# Patient Record
Sex: Male | Born: 1940 | Race: Black or African American | Hispanic: No | Marital: Married | Smoking: Current some day smoker
Health system: Southern US, Community
[De-identification: ages and names within clinical notes are randomized; demographics above are authoritative.]

## PROBLEM LIST (undated history)

## (undated) DIAGNOSIS — I10 Essential (primary) hypertension: Secondary | ICD-10-CM

---

## 2014-07-08 ENCOUNTER — Emergency Department (HOSPITAL_COMMUNITY): Payer: PRIVATE HEALTH INSURANCE

## 2014-07-08 ENCOUNTER — Encounter (HOSPITAL_COMMUNITY): Payer: Self-pay | Admitting: *Deleted

## 2014-07-08 ENCOUNTER — Emergency Department (HOSPITAL_COMMUNITY)
Admission: EM | Admit: 2014-07-08 | Discharge: 2014-07-08 | Disposition: A | Discharge status: Home / Self Care | Payer: PRIVATE HEALTH INSURANCE | Attending: Emergency Medicine | Admitting: Emergency Medicine

## 2014-07-08 DIAGNOSIS — R531 Weakness: Secondary | ICD-10-CM | POA: Insufficient documentation

## 2014-07-08 DIAGNOSIS — J159 Unspecified bacterial pneumonia: Secondary | ICD-10-CM | POA: Diagnosis not present

## 2014-07-08 DIAGNOSIS — R509 Fever, unspecified: Secondary | ICD-10-CM | POA: Diagnosis present

## 2014-07-08 DIAGNOSIS — I10 Essential (primary) hypertension: Secondary | ICD-10-CM | POA: Diagnosis not present

## 2014-07-08 DIAGNOSIS — J189 Pneumonia, unspecified organism: Secondary | ICD-10-CM

## 2014-07-08 HISTORY — DX: Essential (primary) hypertension: I10

## 2014-07-08 LAB — COMPREHENSIVE METABOLIC PANEL
ALBUMIN: 3.3 g/dL — AB (ref 3.5–5.2)
ALK PHOS: 50 U/L (ref 39–117)
ALT: 30 U/L (ref 0–53)
AST: 44 U/L — ABNORMAL HIGH (ref 0–37)
Anion gap: 8 (ref 5–15)
BILIRUBIN TOTAL: 1.7 mg/dL — AB (ref 0.3–1.2)
BUN: 10 mg/dL (ref 6–23)
CHLORIDE: 103 mmol/L (ref 96–112)
CO2: 23 mmol/L (ref 19–32)
Calcium: 8.6 mg/dL (ref 8.4–10.5)
Creatinine, Ser: 1.15 mg/dL (ref 0.50–1.35)
GFR calc Af Amer: 71 mL/min — ABNORMAL LOW (ref >90)
GFR calc non Af Amer: 61 mL/min — ABNORMAL LOW (ref >90)
GLUCOSE: 85 mg/dL (ref 70–99)
Potassium: 4.3 mmol/L (ref 3.5–5.1)
Sodium: 134 mmol/L — ABNORMAL LOW (ref 135–145)
Total Protein: 6.7 g/dL (ref 6.0–8.3)

## 2014-07-08 LAB — CBC WITH DIFFERENTIAL/PLATELET
Basophils Absolute: 0 10*3/uL (ref 0.0–0.1)
Basophils Relative: 0 % (ref 0–1)
Eosinophils Absolute: 0 10*3/uL (ref 0.0–0.7)
Eosinophils Relative: 0 % (ref 0–5)
HCT: 38.5 % — ABNORMAL LOW (ref 39.0–52.0)
HEMOGLOBIN: 12.8 g/dL — AB (ref 13.0–17.0)
Lymphocytes Relative: 13 % (ref 12–46)
Lymphs Abs: 1.4 10*3/uL (ref 0.7–4.0)
MCH: 27.4 pg (ref 26.0–34.0)
MCHC: 33.2 g/dL (ref 30.0–36.0)
MCV: 82.4 fL (ref 78.0–100.0)
MONO ABS: 1.1 10*3/uL — AB (ref 0.1–1.0)
Monocytes Relative: 11 % (ref 3–12)
NEUTROS ABS: 7.7 10*3/uL (ref 1.7–7.7)
Neutrophils Relative %: 76 % (ref 43–77)
Platelets: 235 10*3/uL (ref 150–400)
RBC: 4.67 MIL/uL (ref 4.22–5.81)
RDW: 14.5 % (ref 11.5–15.5)
WBC: 10.2 10*3/uL (ref 4.0–10.5)

## 2014-07-08 MED ORDER — DEXTROSE 5 % IV SOLN
1.0000 g | Freq: Once | INTRAVENOUS | Status: AC
  Administered 2014-07-08: 1 g via INTRAVENOUS
  Filled 2014-07-08: qty 10

## 2014-07-08 MED ORDER — LEVOFLOXACIN 500 MG PO TABS
500.0000 mg | ORAL_TABLET | Freq: Once | ORAL | Status: AC
  Administered 2014-07-08: 500 mg via ORAL
  Filled 2014-07-08: qty 1

## 2014-07-08 MED ORDER — LEVOFLOXACIN 500 MG PO TABS: 500.0000 mg | ORAL_TABLET | Freq: Every day | ORAL | Status: DC

## 2014-07-08 MED ORDER — ACETAMINOPHEN 325 MG PO TABS
650.0000 mg | ORAL_TABLET | Freq: Once | ORAL | Status: AC
Start: 2014-07-08 — End: 2014-07-08
  Administered 2014-07-08: 650 mg via ORAL
  Filled 2014-07-08: qty 2

## 2014-07-08 MED ORDER — SODIUM CHLORIDE 0.9 % IV BOLUS (SEPSIS)
1000.0000 mL | Freq: Once | INTRAVENOUS | Status: AC
  Administered 2014-07-08: 1000 mL via INTRAVENOUS

## 2014-07-08 NOTE — ED Notes (Signed)
States he just moved here from [place] 4 days ago. Drove here. Since has had fever, sinus congestion, and feels weak. Pt appears in nad. Denies cp or sob. Alert and oriented. Ambulatory without any difficulty.

## 2014-07-08 NOTE — ED Provider Notes (Signed)
CSN: [id number]     Arrival date & time 07/08/14  1851 History   First MD Initiated Contact with Patient 07/08/14 1921     Chief Complaint  Patient presents with  . Nasal Congestion  . Weakness  . Fever     Patient is a 74 y.o. male presenting with weakness and fever. The history is provided by the patient. No language interpreter was used.  Weakness  Fever  Steven Lane presents for the evaluation of fever and weakness. He reports 3 days of feeling poorly. He has subjective fevers at home. He has nasal congestion and sore throat with sneezing and cough. He has no chest pain or shortness of breath. He has no abdominal pain, vomiting, diarrhea, leg swelling or pain. He has a history of high blood pressure. He has no known sick contacts. He recently moved here 4 days ago from [place] and had a 12 Hour drive. Symptoms are moderate, constant, worsening.  Past Medical History  Diagnosis Date  . Hypertension    History reviewed. No pertinent past surgical history. History reviewed. No pertinent family history. History  Substance Use Topics  . Smoking status: Never Smoker   . Smokeless tobacco: Not on file  . Alcohol Use: Yes    Review of Systems  Constitutional: Positive for fever.  Neurological: Positive for weakness.  All other systems reviewed and are negative.     Allergies  Review of patient's allergies indicates no known allergies.  Home Medications   Prior to Admission medications   Not on File   BP 149/68 mmHg  Pulse 68  Temp(Src) 99.1 F (37.3 C) (Oral)  Resp 19  Ht 5\' 9"  (1.753 m)  Wt 158 lb (71.668 kg)  BMI 23.32 kg/m2  SpO2 93% Physical Exam  Constitutional: He is oriented to person, place, and time. He appears well-developed and well-nourished.  HENT:  Head: Normocephalic and atraumatic.  Cardiovascular: Normal rate and regular rhythm.   No murmur heard. Pulmonary/Chest: Effort normal and breath sounds normal. No respiratory distress.  Abdominal:  Soft. There is no tenderness. There is no rebound and no guarding.  Musculoskeletal: He exhibits no edema or tenderness.  Neurological: He is alert and oriented to person, place, and time.  Skin: Skin is warm and dry.  Psychiatric: He has a normal mood and affect. His behavior is normal.  Nursing note and vitals reviewed.   ED Course  Procedures (including critical care time) Labs Review Labs Reviewed  COMPREHENSIVE METABOLIC PANEL  CBC WITH DIFFERENTIAL/PLATELET  URINALYSIS, ROUTINE W REFLEX MICROSCOPIC    Imaging Review Dg Chest 2 View  07/08/2014   CLINICAL DATA:  Nasal congestion, weakness, fever for 3 days.  EXAM: CHEST  2 VIEW  COMPARISON:  None.  FINDINGS: The lungs are hyperinflated. There is ill-defined patchy opacity in the right lower lung zone. Minimal retrocardiac subsegmental atelectasis. Minimal blunting of both costophrenic angles, may reflect immediate effusions. The cardiomediastinal contours are normal. Pulmonary vasculature is normal. No pneumothorax. No acute osseous abnormalities are seen.  IMPRESSION: 1. Ill-defined patchy opacity in the right lower lung zone, may reflect pneumonia in the appropriate clinical setting. Continued radiographic follow-up is recommended after course of treatment to document resolution. 2. Suggestion of small bilateral pleural effusions.   Electronically Signed   By: Rubye OaksMelanie  Ehinger M.D.   On: 07/08/2014 21:16     EKG Interpretation None      MDM   Final diagnoses:  Community acquired pneumonia    Patient here  for fever, cough, upper respiratory symptoms. Patient with no acute respiratory distress in the emergency department. Clinical picture is not consistent with sepsis, CHF, respiratory failure. Labs not currently crossing over into the system. CBC is within normal limits. CMP is within normal limits. Chest x-ray is consistent with acute pneumonia given patient's clinical presentation. Treating with one dose of Rocephin and  Levaquin in the emergency department. Provided prescription for Levaquin and home to start tomorrow. Discussed with patient homecare for pneumonia as well as close return precautions for worsening symptoms.   Tilden Fossa, MD 07/08/14 2213

## 2014-07-08 NOTE — Discharge Instructions (Signed)
Please get a repeat chest x ray in the next month to ensure that your pneumonia resolves.     Pneumonia Pneumonia is an infection of the lungs.  CAUSES Pneumonia may be caused by bacteria or a virus. Usually, these infections are caused by breathing infectious particles into the lungs (respiratory tract). SIGNS AND SYMPTOMS   Cough.  Fever.  Chest pain.  Increased rate of breathing.  Wheezing.  Mucus production. DIAGNOSIS  If you have the common symptoms of pneumonia, your health care provider will typically confirm the diagnosis with a chest X-ray. The X-ray will show an abnormality in the lung (pulmonary infiltrate) if you have pneumonia. Other tests of your blood, urine, or sputum may be done to find the specific cause of your pneumonia. Your health care provider may also do tests (blood gases or pulse oximetry) to see how well your lungs are working. TREATMENT  Some forms of pneumonia may be spread to other people when you cough or sneeze. You may be asked to wear a mask before and during your exam. Pneumonia that is caused by bacteria is treated with antibiotic medicine. Pneumonia that is caused by the influenza virus may be treated with an antiviral medicine. Most other viral infections must run their course. These infections will not respond to antibiotics.  HOME CARE INSTRUCTIONS   Cough suppressants may be used if you are losing too much rest. However, coughing protects you by clearing your lungs. You should avoid using cough suppressants if you can.  Your health care provider may have prescribed medicine if he or she thinks your pneumonia is caused by bacteria or influenza. Finish your medicine even if you start to feel better.  Your health care provider may also prescribe an expectorant. This loosens the mucus to be coughed up.  Take medicines only as directed by your health care provider.  Do not smoke. Smoking is a common cause of bronchitis and can contribute to  pneumonia. If you are a smoker and continue to smoke, your cough may last several weeks after your pneumonia has cleared.  A cold steam vaporizer or humidifier in your room or home may help loosen mucus.  Coughing is often worse at night. Sleeping in a semi-upright position in a recliner or using a couple pillows under your head will help with this.  Get rest as you feel it is needed. Your body will usually let you know when you need to rest. PREVENTION A pneumococcal shot (vaccine) is available to prevent a common bacterial cause of pneumonia. This is usually suggested for:  People over 75 years old.  Patients on chemotherapy.  People with chronic lung problems, such as bronchitis or emphysema.  People with immune system problems. If you are over 65 or have a high risk condition, you may receive the pneumococcal vaccine if you have not received it before. In some countries, a routine influenza vaccine is also recommended. This vaccine can help prevent some cases of pneumonia.You may be offered the influenza vaccine as part of your care. If you smoke, it is time to quit. You may receive instructions on how to stop smoking. Your health care provider can provide medicines and counseling to help you quit. SEEK MEDICAL CARE IF: You have a fever. SEEK IMMEDIATE MEDICAL CARE IF:   Your illness becomes worse. This is especially true if you are elderly or weakened from any other disease.  You cannot control your cough with suppressants and are losing sleep.  You begin  coughing up blood.  You develop pain which is getting worse or is uncontrolled with medicines.  Any of the symptoms which initially brought you in for treatment are getting worse rather than better.  You develop shortness of breath or chest pain. MAKE SURE YOU:   Understand these instructions.  Will watch your condition.  Will get help right away if you are not doing well or get worse. Document Released: 04/20/2005  Document Revised: 09/04/2013 Document Reviewed: 07/10/2010 [facility] Patient Information 2015 [place], [place]. This information is not intended to replace advice given to you by your health care provider. Make sure you discuss any questions you have with your health care provider.

## 2014-07-09 LAB — CBC WITH DIFFERENTIAL/PLATELET

## 2014-09-14 ENCOUNTER — Ambulatory Visit: Payer: PRIVATE HEALTH INSURANCE

## 2014-09-25 ENCOUNTER — Ambulatory Visit: Payer: PRIVATE HEALTH INSURANCE | Attending: Internal Medicine

## 2014-10-12 ENCOUNTER — Emergency Department (INDEPENDENT_AMBULATORY_CARE_PROVIDER_SITE_OTHER)
Admission: EM | Admit: 2014-10-12 | Discharge: 2014-10-12 | Disposition: A | Discharge status: Home / Self Care | Payer: PRIVATE HEALTH INSURANCE | Source: Home / Self Care | Attending: Family Medicine | Admitting: Family Medicine

## 2014-10-12 ENCOUNTER — Encounter (HOSPITAL_COMMUNITY): Payer: Self-pay | Admitting: *Deleted

## 2014-10-12 DIAGNOSIS — I10 Essential (primary) hypertension: Secondary | ICD-10-CM

## 2014-10-12 HISTORY — DX: Essential (primary) hypertension: I10

## 2014-10-12 NOTE — ED Notes (Signed)
Pt was here for Rx refills. However, pt still has 2 refills left on all 3 of his current prescribed meds at [facility] on [address]. Pt is a new pt at the [facility] and wants Rxs filled there. Development worker, community and [facility] contacted. Both have pts info on file. Pt sent to [facility]. Dr. Denyse Amass aware, denies need to see pt.

## 2014-10-12 NOTE — ED Provider Notes (Signed)
She presents to clinic today for medication refill. He has active prescriptions with refills at a [facility]. He wants to have his prescriptions filled at the [facility] pharmacy. He was unaware that these could be transferred. The RN successfully arranged for the transfer. The patient does not need to be seen by a physician today because he has no problems.  Filed Vitals:   10/12/14 1523  BP: 121/62  Pulse: 62  Temp: 97.8 F (36.6 C)  Resp: 16     Rodolph Bong, MD 10/12/14 1528

## 2014-10-12 NOTE — ED Notes (Signed)
Pt is here for RX refill. Prescriptions just need transferred from [facility] on [address] to [facility] pharmacy.

## 2014-12-04 ENCOUNTER — Encounter: Payer: Self-pay | Admitting: Family Medicine

## 2014-12-04 ENCOUNTER — Ambulatory Visit: Payer: Self-pay | Attending: Family Medicine | Admitting: Family Medicine

## 2014-12-04 VITALS — BP 159/97 | HR 65 | Temp 97.4°F | Ht 66.0 in | Wt 165.0 lb

## 2014-12-04 DIAGNOSIS — R972 Elevated prostate specific antigen [PSA]: Secondary | ICD-10-CM

## 2014-12-04 DIAGNOSIS — I10 Essential (primary) hypertension: Secondary | ICD-10-CM | POA: Insufficient documentation

## 2014-12-04 DIAGNOSIS — N400 Enlarged prostate without lower urinary tract symptoms: Secondary | ICD-10-CM | POA: Insufficient documentation

## 2014-12-04 LAB — BASIC METABOLIC PANEL
BUN: 14 mg/dL (ref 7–25)
CO2: 26 mmol/L (ref 20–31)
Calcium: 9.2 mg/dL (ref 8.6–10.3)
Chloride: 101 mmol/L (ref 98–110)
Creat: 0.98 mg/dL (ref 0.70–1.18)
Glucose, Bld: 83 mg/dL (ref 65–99)
POTASSIUM: 4.1 mmol/L (ref 3.5–5.3)
Sodium: 137 mmol/L (ref 135–146)

## 2014-12-04 MED ORDER — TERAZOSIN HCL 5 MG PO CAPS: 5.0000 mg | ORAL_CAPSULE | Freq: Every day | ORAL | Status: DC

## 2014-12-04 MED ORDER — AMLODIPINE BESYLATE 10 MG PO TABS: 10.0000 mg | ORAL_TABLET | Freq: Every day | ORAL | Status: DC

## 2014-12-04 MED ORDER — LISINOPRIL 20 MG PO TABS: 20.0000 mg | ORAL_TABLET | Freq: Every day | ORAL | Status: DC

## 2014-12-04 NOTE — Progress Notes (Signed)
Subjective:    Patient ID: Steven Lane, male    DOB: 04/18/1941, 74 y.o.   MRN: [id number]  HPI  Steven Lane is a 74 year old male with a history of hypertension and benign prostatic hyperplasia who relocated from [place] 4 months ago and is without a primary care physician. He  Had presented to the urgent care for a refill of his medications where he was not seen and was referred to the clinic here.  Of his antihypertensives and was told he needed to establish care here. He has no complaints at this time;  States he has been out of his medications for the last 4 days.  Past Medical History  Diagnosis Date  . Hypertension   . HTN (hypertension)     History reviewed. No pertinent past surgical history.  History   Social History  . Marital Status: Married    Spouse Name: N/A  . Number of Children: N/A  . Years of Education: N/A   Occupational History  . Not on file.   Social History Main Topics  . Smoking status: Never Smoker   . Smokeless tobacco: Not on file  . Alcohol Use: Yes     Comment: occ  . Drug Use: No  . Sexual Activity: Not on file   Other Topics Concern  . Not on file   Social History Narrative    No Known Allergies  No current outpatient prescriptions on file prior to visit.   No current facility-administered medications on file prior to visit.      Review of Systems  Constitutional: Negative for activity change and appetite change.  HENT: Negative for sinus pressure and sore throat.   Eyes: Negative for visual disturbance.  Respiratory: Negative for cough, chest tightness and shortness of breath.   Cardiovascular: Negative for chest pain and leg swelling.  Gastrointestinal: Negative for abdominal pain, diarrhea, constipation and abdominal distention.  Endocrine: Negative.   Genitourinary: Negative for dysuria.  Musculoskeletal: Negative for myalgias and joint swelling.  Skin: Negative for rash.  Allergic/Immunologic: Negative.     Neurological: Negative for weakness, light-headedness and numbness.  Psychiatric/Behavioral: Negative for suicidal ideas and dysphoric mood.         Objective: Filed Vitals:   12/04/14 1237  BP: 159/97  Pulse: 65  Temp: 97.4 F (36.3 C)  Height:  (1.676 m)  Weight: 165 lb (74.844 kg)  SpO2: 94%      Physical Exam  Constitutional: He is oriented to person, place, and time. He appears well-developed and well-nourished.  Cardiovascular: Normal rate, normal heart sounds and intact distal pulses.   No murmur heard. Pulmonary/Chest: Effort normal and breath sounds normal. He has no wheezes. He has no rales. He exhibits no tenderness.  Abdominal: Soft. Bowel sounds are normal. He exhibits no distension and no mass. There is no tenderness.  Musculoskeletal: Normal range of motion.  Neurological: He is alert and oriented to person, place, and time.            Assessment & Plan:   74 year old male with a history of hypertension and BPH.    Hypertension: Uncontrolled due to running out of medications which I have refilled at this time.  Basic metabolic panel ordered to evaluate renal function given he is on ACE inhibitor. Advised and low-sodium, DASH diet.   BPH: Controlled on Terazosin   This note has been created with Education officer, environmental. Any transcriptional errors are unintentional.

## 2014-12-04 NOTE — Progress Notes (Signed)
Quick Note:  Please inform the patient that labs are normal. Thank you. ______ 

## 2014-12-04 NOTE — Progress Notes (Signed)
Patient here to establish care and to get medication refills on his HTN medications He states he has been with out medications for 3 days No other complaints Pressure today is 159/97 Pulse 65

## 2014-12-04 NOTE — Patient Instructions (Signed)

## 2014-12-05 ENCOUNTER — Telehealth: Payer: Self-pay | Admitting: *Deleted

## 2014-12-05 NOTE — Telephone Encounter (Signed)
-----   Message from Enobong Amao, MD sent at 12/04/2014 11:19 PM EDT ----- Please inform the patient that labs are normal. Thank you. 

## 2014-12-05 NOTE — Telephone Encounter (Signed)
Verified name and date of birth and relayed normal lab results.  Patient had no questions and verbalized understanding.

## 2015-01-23 ENCOUNTER — Ambulatory Visit: Payer: Self-pay | Attending: Internal Medicine | Admitting: Internal Medicine

## 2015-01-23 ENCOUNTER — Encounter: Payer: Self-pay | Admitting: Internal Medicine

## 2015-01-23 ENCOUNTER — Other Ambulatory Visit: Payer: Self-pay | Admitting: Family Medicine

## 2015-01-23 VITALS — BP 149/74 | HR 64 | Temp 98.0°F | Resp 16 | Ht 66.0 in | Wt 163.8 lb

## 2015-01-23 DIAGNOSIS — N400 Enlarged prostate without lower urinary tract symptoms: Secondary | ICD-10-CM | POA: Insufficient documentation

## 2015-01-23 DIAGNOSIS — I10 Essential (primary) hypertension: Secondary | ICD-10-CM | POA: Insufficient documentation

## 2015-01-23 DIAGNOSIS — Z23 Encounter for immunization: Secondary | ICD-10-CM | POA: Insufficient documentation

## 2015-01-23 MED ORDER — TERAZOSIN HCL 5 MG PO CAPS: 5.0000 mg | ORAL_CAPSULE | Freq: Every day | ORAL | Status: DC

## 2015-01-23 MED ORDER — LISINOPRIL 20 MG PO TABS: 20.0000 mg | ORAL_TABLET | Freq: Every day | ORAL | Status: DC

## 2015-01-23 MED ORDER — AMLODIPINE BESYLATE 10 MG PO TABS: 10.0000 mg | ORAL_TABLET | Freq: Every day | ORAL | Status: DC

## 2015-01-23 NOTE — Progress Notes (Signed)
Patient here for follow up on his HTN and for  Medication refills Patient stated he did have an ulcer in the past and that The prilosec seems to help

## 2015-01-23 NOTE — Progress Notes (Signed)
Patient ID: Steven Lane, male   DOB: 10-01-1940, 74 y.o.   MRN: [id number] Subjective:  Steven Lane is a 74 y.o. male with hypertension. Current Outpatient Prescriptions  Medication Sig Dispense Refill  . amLODipine (NORVASC) 10 MG tablet Take 1 tablet (10 mg total) by mouth daily. 30 tablet 1  . aspirin 81 MG tablet Take 81 mg by mouth daily.    Marland Kitchen lisinopril (PRINIVIL,ZESTRIL) 20 MG tablet Take 1 tablet (20 mg total) by mouth daily. 30 tablet 1  . Omeprazole (PRILOSEC PO) Take by mouth.    . terazosin (HYTRIN) 5 MG capsule Take 1 capsule (5 mg total) by mouth at bedtime. 30 capsule 1   No current facility-administered medications for this visit.    Hypertension ROS: taking medications as instructed, no medication side effects noted, no TIA's, no chest pain on exertion, no dyspnea on exertion, no swelling of ankles and no palpitations.  New concerns: has had acid reflux and ulcers in he past. He admits to eating spicy foods daily.   Objective:  BP 149/74 mmHg  Pulse 64  Temp(Src) 98 F (36.7 C)  Resp 16  Ht  (1.676 m)  Wt 163 lb 12.8 oz (74.299 kg)  BMI 26.45 kg/m2  SpO2 100%  Appearance alert, well appearing, and in no distress, oriented to person, place, and time and normal appearing weight. General exam BP noted to be well controlled today in office, S1, S2 normal, no gallop, no murmur, chest clear, no JVD, no HSM, no edema.  Lab review: labs are reviewed, up to date and normal.   Assessment:   Hypertension stable and needs to follow diet more regularly.  BPH: Terazosin refilled. Will check PSA on next visit Influenza vaccine given today   Plan:  Current treatment plan is effective, no change in therapy. Reviewed diet, exercise and weight control. Recommended sodium restriction. Cardiovascular risk and specific lipid/LDL goals reviewed.    Return in about 3 months (around 04/24/2015) for Hypertension.  Ambrose Finland, NP 01/23/2015 4:23 PM

## 2015-05-26 ENCOUNTER — Emergency Department (HOSPITAL_COMMUNITY): Payer: Self-pay

## 2015-05-26 ENCOUNTER — Emergency Department (HOSPITAL_COMMUNITY)
Admission: EM | Admit: 2015-05-26 | Discharge: 2015-05-26 | Disposition: A | Discharge status: Home / Self Care | Payer: Self-pay | Attending: Emergency Medicine | Admitting: Emergency Medicine

## 2015-05-26 ENCOUNTER — Encounter (HOSPITAL_COMMUNITY): Payer: Self-pay | Admitting: Emergency Medicine

## 2015-05-26 DIAGNOSIS — I10 Essential (primary) hypertension: Secondary | ICD-10-CM | POA: Insufficient documentation

## 2015-05-26 DIAGNOSIS — Z79899 Other long term (current) drug therapy: Secondary | ICD-10-CM | POA: Insufficient documentation

## 2015-05-26 DIAGNOSIS — Y9389 Activity, other specified: Secondary | ICD-10-CM | POA: Insufficient documentation

## 2015-05-26 DIAGNOSIS — J3489 Other specified disorders of nose and nasal sinuses: Secondary | ICD-10-CM | POA: Insufficient documentation

## 2015-05-26 DIAGNOSIS — R55 Syncope and collapse: Secondary | ICD-10-CM | POA: Insufficient documentation

## 2015-05-26 DIAGNOSIS — Z7982 Long term (current) use of aspirin: Secondary | ICD-10-CM | POA: Insufficient documentation

## 2015-05-26 DIAGNOSIS — Z043 Encounter for examination and observation following other accident: Secondary | ICD-10-CM | POA: Insufficient documentation

## 2015-05-26 DIAGNOSIS — R001 Bradycardia, unspecified: Secondary | ICD-10-CM | POA: Insufficient documentation

## 2015-05-26 DIAGNOSIS — Y9289 Other specified places as the place of occurrence of the external cause: Secondary | ICD-10-CM | POA: Insufficient documentation

## 2015-05-26 DIAGNOSIS — Y998 Other external cause status: Secondary | ICD-10-CM | POA: Insufficient documentation

## 2015-05-26 LAB — URINALYSIS, ROUTINE W REFLEX MICROSCOPIC
Bilirubin Urine: NEGATIVE
Glucose, UA: NEGATIVE mg/dL
HGB URINE DIPSTICK: NEGATIVE
KETONES UR: 15 mg/dL — AB
Leukocytes, UA: NEGATIVE
Nitrite: NEGATIVE
PROTEIN: NEGATIVE mg/dL
Specific Gravity, Urine: 1.014 (ref 1.005–1.030)
pH: 6 (ref 5.0–8.0)

## 2015-05-26 LAB — I-STAT CG4 LACTIC ACID, ED: Lactic Acid, Venous: 0.93 mmol/L (ref 0.5–2.0)

## 2015-05-26 LAB — CBC
HCT: 40.1 % (ref 39.0–52.0)
Hemoglobin: 13.7 g/dL (ref 13.0–17.0)
MCH: 28.1 pg (ref 26.0–34.0)
MCHC: 34.2 g/dL (ref 30.0–36.0)
MCV: 82.2 fL (ref 78.0–100.0)
PLATELETS: 218 10*3/uL (ref 150–400)
RBC: 4.88 MIL/uL (ref 4.22–5.81)
RDW: 13.9 % (ref 11.5–15.5)
WBC: 5.7 10*3/uL (ref 4.0–10.5)

## 2015-05-26 LAB — BASIC METABOLIC PANEL
Anion gap: 12 (ref 5–15)
BUN: 11 mg/dL (ref 6–20)
CALCIUM: 9.2 mg/dL (ref 8.9–10.3)
CHLORIDE: 105 mmol/L (ref 101–111)
CO2: 21 mmol/L — ABNORMAL LOW (ref 22–32)
CREATININE: 1.07 mg/dL (ref 0.61–1.24)
GFR calc Af Amer: >60 mL/min (ref >60)
GFR calc non Af Amer: >60 mL/min (ref >60)
Glucose, Bld: 91 mg/dL (ref 65–99)
Potassium: 4.6 mmol/L (ref 3.5–5.1)
Sodium: 138 mmol/L (ref 135–145)

## 2015-05-26 LAB — I-STAT TROPONIN, ED: TROPONIN I, POC: 0.01 ng/mL (ref 0.00–0.08)

## 2015-05-26 MED ORDER — SODIUM CHLORIDE 0.9 % IV BOLUS (SEPSIS)
1000.0000 mL | Freq: Once | INTRAVENOUS | Status: AC
  Administered 2015-05-26: 1000 mL via INTRAVENOUS

## 2015-05-26 NOTE — ED Notes (Signed)
Water given as PO challenge as requested.

## 2015-05-26 NOTE — ED Provider Notes (Signed)
CSN: [id number]     Arrival date & time 05/26/15  1456 History   First MD Initiated Contact with Patient 05/26/15 1808     Chief Complaint  Patient presents with  . Fall  . Bradycardia     (Consider location/radiation/quality/duration/timing/severity/associated sxs/prior Treatment) HPI 57 y M w PMH htn who presents with a 4 days of cough/runny nose, no fevers, no vomiting/diarrhea but says he hasn't been eating very well.  Today he went to have a bowel movement which was normal.  After the bowel movement he stood up and he began feeling lightheaded and had a syncopal episode.  During the syncope he fell hitting his head.  He woke up and has since come back to baseline.  He denies any complaints at this time.  No chest pain/sob.   Past Medical History  Diagnosis Date  . Hypertension   . HTN (hypertension)    History reviewed. No pertinent past surgical history. No family history on file. Social History  Substance Use Topics  . Smoking status: Never Smoker   . Smokeless tobacco: None  . Alcohol Use: Yes     Comment: occ    Review of Systems  Constitutional: Negative for fever and chills.  HENT: Positive for rhinorrhea.   Eyes: Negative for redness.  Respiratory: Positive for cough. Negative for shortness of breath.   Cardiovascular: Negative for chest pain.  Gastrointestinal: Negative for nausea, vomiting, abdominal pain and diarrhea.  Genitourinary: Negative for dysuria.  Skin: Negative for rash.  Neurological: Positive for syncope. Negative for headaches.  All other systems reviewed and are negative.     Allergies  Review of patient's allergies indicates no known allergies.  Home Medications   Prior to Admission medications   Medication Sig Start Date End Date Taking? Authorizing Provider  amLODipine (NORVASC) 10 MG tablet Take 1 tablet (10 mg total) by mouth daily. 01/23/15  Yes Ambrose Finland, NP  aspirin 81 MG tablet Take 81 mg by mouth daily.   Yes Historical  Provider, MD  Dextromethorphan-Guaifenesin (ROBITUSSIN DM PO) Take 10 mLs by mouth daily as needed. For cough   Yes Historical Provider, MD  lisinopril (PRINIVIL,ZESTRIL) 20 MG tablet Take 1 tablet (20 mg total) by mouth daily. 01/23/15  Yes Ambrose Finland, NP  Omeprazole (PRILOSEC PO) Take 1 tablet by mouth daily.    Yes Historical Provider, MD  terazosin (HYTRIN) 5 MG capsule Take 1 capsule (5 mg total) by mouth at bedtime. 01/23/15  Yes Ambrose Finland, NP   BP 147/80 mmHg  Pulse 69  Temp(Src) 97.9 F (36.6 C) (Oral)  Resp 18  Ht  (1.651 m)  Wt 72.122 kg  BMI 26.46 kg/m2  SpO2 95% Physical Exam  Constitutional: He is oriented to person, place, and time. No distress.  HENT:  Head: Normocephalic and atraumatic.  Eyes: EOM are normal. Pupils are equal, round, and reactive to light.  Neck: Normal range of motion. Neck supple.  Cardiovascular: Regular rhythm and normal heart sounds.   Pulmonary/Chest: Effort normal and breath sounds normal. No respiratory distress.  Abdominal: Soft. There is no tenderness.  Musculoskeletal: Normal range of motion.  Neurological: He is alert and oriented to person, place, and time.  Skin: No rash noted. He is not diaphoretic.  Psychiatric: He has a normal mood and affect.  Nursing note reviewed.   ED Course  Procedures (including critical care time) Labs Review Labs Reviewed  BASIC METABOLIC PANEL - Abnormal; Notable for the following:  CO2 21 (*)    All other components within normal limits  URINALYSIS, ROUTINE W REFLEX MICROSCOPIC (NOT AT [facility]) - Abnormal; Notable for the following:    Ketones, ur 15 (*)    All other components within normal limits  CBC  CBG MONITORING, ED  I-STAT CG4 LACTIC ACID, ED  Rosezena Sensor, ED    Imaging Review Dg Chest 2 View  05/26/2015  CLINICAL DATA:  Cough and headache today, dizziness, fell in bathroom EXAM: CHEST  2 VIEW COMPARISON:  07/08/2014 FINDINGS: Heart size and vascular pattern are  normal. No consolidation effusion or pneumothorax. Bony thorax intact. IMPRESSION: No active cardiopulmonary disease. Electronically Signed   By: Esperanza Heir M.D.   On: 05/26/2015 19:38   Ct Head Wo Contrast  05/26/2015  CLINICAL DATA:  Loss of consciousness.  Headache. EXAM: CT HEAD WITHOUT CONTRAST TECHNIQUE: Contiguous axial images were obtained from the base of the skull through the vertex without intravenous contrast. COMPARISON:  None. FINDINGS: Skull and Sinuses:Negative for acute fracture. Mucosal thickening throughout the paranasal sinuses. Visualized orbits: Negative. Brain: No evidence of acute infarction, hemorrhage, hydrocephalus, or mass lesion/mass effect. There is generalized cerebral volume loss. Mild for age small-vessel ischemic change in the deep cerebral white matter, confluent around the lateral ventricles. Intracranial calcified atherosclerosis. IMPRESSION: No acute finding. Electronically Signed   By: Marnee Spring M.D.   On: 05/26/2015 19:44   I have personally reviewed and evaluated these images and lab results as part of my medical decision-making.   EKG Interpretation   Date/Time:  Sunday May 26 2015 15:35:14 EST Ventricular Rate:  60 PR Interval:  190 QRS Duration: 84 QT Interval:  412 QTC Calculation: 412 R Axis:   -11 Text Interpretation:  Normal sinus rhythm Non-specific ST-t changes No  previous tracing Confirmed by Denton Lank  MD, Caryn Bee ([id number]) on 05/26/2015  6:33:13 PM      MDM   Final diagnoses:  Syncope and collapse    86 y M w PMH htn who presents with a 4 days of cough/runny nose, no fevers, no vomiting/diarrhea who presents now with syncope.  Exam as above.  ekg unremarkable.  No wpw, long qt or brugada. AFVSS.  No heart murmur.    Given the fall and his age, will obtain ct head.  He has no c/t/l spine ttp.  No chest wall or abdominal ttp.  Will obtain cbc/bmp to further look for causes of syncope such as anemia or electroyte abnormality.   Will obtain orthostatics  Orthostatics neg.  Cbc/bmp unremarkable.  Ct head neg.  cxr obtained and neg for pna or other abnormality.  At this point it seems that his syncope was most likely 2/2 dehydration from decreased PO intake over the past couple of days or possible vagal episode following the bowel movement.  Patient does have mild bradycardia in the department which is sinus and he has a good bp.  Will have him follow up with pcp to keep following that.  Using the [facility] syncope rules, feel that he is sufficiently low risk for dangerous cardiac etiology of syncope and feel he is safe for d/c at this time.  I have discussed the results, Dx and Tx plan with the pt. They expressed understanding and agree with the plan and were told to return to ED with any worsening of condition or concern.    Disposition: Discharge  Condition: Good  Discharge Medication List as of 05/26/2015  8:36 PM  Follow Up: [facility] EMERGENCY DEPARTMENT [address] [id number] mc [place] [address] [phone]  If symptoms worsen, As needed  [facility] [address] [place] [address] [phone]  Please follow up with primary care in one week for mild bradycardia.   Pt seen in conjunction with Dr. Imagene Sheller, MD 05/27/15 1238  Cathren Laine, MD 05/28/15 1620

## 2015-05-26 NOTE — ED Notes (Signed)
Pt reports that he was standing in the kitchen and felt like he had to go to bathroom. After using the bathroom pt became dizzy and fell. Pt reports + LOC hit head on bathroom floor. Speech clear, no facial droop, equal grips.

## 2015-05-26 NOTE — Discharge Instructions (Signed)

## 2015-05-26 NOTE — ED Notes (Signed)
Patient ambulated in hallway with no difficulty, MD aware. Prepare for dishcarge.

## 2015-05-30 ENCOUNTER — Other Ambulatory Visit: Payer: Self-pay | Admitting: Internal Medicine

## 2015-05-30 MED FILL — ?TERAZOSIN 5 MG CAPSULE: 5 | 30 days supply | Qty: 30 | Fill #3

## 2015-05-30 MED FILL — ?LISINOPRIL 20 MG TABLET: 20 | 30 days supply | Qty: 30 | Fill #3

## 2015-06-06 ENCOUNTER — Ambulatory Visit: Payer: Self-pay | Attending: Internal Medicine | Admitting: Internal Medicine

## 2015-06-06 ENCOUNTER — Encounter: Payer: Self-pay | Admitting: Internal Medicine

## 2015-06-06 VITALS — BP 115/74 | HR 58 | Temp 98.0°F | Resp 16 | Ht 66.0 in | Wt 165.0 lb

## 2015-06-06 DIAGNOSIS — R001 Bradycardia, unspecified: Secondary | ICD-10-CM | POA: Insufficient documentation

## 2015-06-06 DIAGNOSIS — N400 Enlarged prostate without lower urinary tract symptoms: Secondary | ICD-10-CM | POA: Insufficient documentation

## 2015-06-06 DIAGNOSIS — I10 Essential (primary) hypertension: Secondary | ICD-10-CM

## 2015-06-06 DIAGNOSIS — Z7982 Long term (current) use of aspirin: Secondary | ICD-10-CM | POA: Insufficient documentation

## 2015-06-06 DIAGNOSIS — Z79899 Other long term (current) drug therapy: Secondary | ICD-10-CM | POA: Insufficient documentation

## 2015-06-06 DIAGNOSIS — R55 Syncope and collapse: Secondary | ICD-10-CM

## 2015-06-06 MED ORDER — AMLODIPINE BESYLATE 10 MG PO TABS: 10.0000 mg | ORAL_TABLET | Freq: Every day | ORAL | Status: DC

## 2015-06-06 MED ORDER — LISINOPRIL 20 MG PO TABS: 20.0000 mg | ORAL_TABLET | Freq: Every day | ORAL | Status: DC

## 2015-06-06 MED FILL — ?AMLODIPINE BESYLATE 10 MG: 10 | 30 days supply | Qty: 30 | Fill #0

## 2015-06-06 NOTE — Patient Instructions (Signed)
Men's one a day vitamin

## 2015-06-06 NOTE — Progress Notes (Signed)
Patient ID: Steven Lane, male   DOB: 09/03/1940, 75 y.o.   MRN: [id number]  CC: syncope  HPI: Steven Lane is a 76 y.o. male here today for a follow up visit.  Patient has past medical history of HTN and BPH. Patient states that he was seen in the ER 10 days ago after suffering a syncopal event after having a bowel movement. He went to stand up after using the restroom and fell over hitting his head. Everything returned normal in the ER except the patient was slightly bradycardic at the time. Patient has not had anymore episodes since that time but he feels weak. He denies complaints of dizziness, chest pain, near syncope, or palpitations.   No Known Allergies Past Medical History  Diagnosis Date  . Hypertension   . HTN (hypertension)    Current Outpatient Prescriptions on File Prior to Visit  Medication Sig Dispense Refill  . amLODipine (NORVASC) 10 MG tablet Take 1 tablet (10 mg total) by mouth daily. Needs office visit for refills 30 tablet 0  . aspirin 81 MG tablet Take 81 mg by mouth daily.    Marland Kitchen lisinopril (PRINIVIL,ZESTRIL) 20 MG tablet Take 1 tablet (20 mg total) by mouth daily. 30 tablet 3  . Omeprazole (PRILOSEC PO) Take 1 tablet by mouth daily.     Marland Kitchen terazosin (HYTRIN) 5 MG capsule Take 1 capsule (5 mg total) by mouth at bedtime. 30 capsule 3  . Dextromethorphan-Guaifenesin (ROBITUSSIN DM PO) Take 10 mLs by mouth daily as needed. For cough     No current facility-administered medications on file prior to visit.   No family history on file. Social History   Social History  . Marital Status: Married    Spouse Name: N/A  . Number of Children: N/A  . Years of Education: N/A   Occupational History  . Not on file.   Social History Main Topics  . Smoking status: Never Smoker   . Smokeless tobacco: Not on file  . Alcohol Use: Yes     Comment: occ  . Drug Use: No  . Sexual Activity: Not on file   Other Topics Concern  . Not on file   Social History Narrative     Review of Systems: Constitutional: Negative for fever, chills, diaphoresis, activity change, appetite change and fatigue. HENT: Negative for ear pain, nosebleeds, congestion, facial swelling, rhinorrhea, neck pain, neck stiffness and ear discharge.  Eyes: Negative for pain, discharge, redness, itching and visual disturbance. Respiratory: Negative for cough, choking, chest tightness, shortness of breath, wheezing and stridor.  Cardiovascular: Negative for chest pain, palpitations and leg swelling. Gastrointestinal: Negative for abdominal distention. Genitourinary: Negative for dysuria, urgency, frequency, hematuria, flank pain, decreased urine volume, difficulty urinating and dyspareunia.  Musculoskeletal: Negative for back pain, joint swelling, arthralgias and gait problem. Neurological: Negative for dizziness, tremors, seizures, syncope, facial asymmetry, speech difficulty, weakness, light-headedness, numbness and headaches.  Hematological: Negative for adenopathy. Does not bruise/bleed easily. Psychiatric/Behavioral: Negative for hallucinations, behavioral problems, confusion, dysphoric mood, decreased concentration and agitation.    Objective:   Filed Vitals:   06/06/15 1400  BP: 115/74  Pulse: 58  Temp: 98 F (36.7 C)  Resp: 16    Physical Exam  Constitutional: He is oriented to person, place, and time.  Cardiovascular: Normal rate and normal heart sounds.   No murmur heard. Slightly bradycardic  Pulmonary/Chest: Effort normal and breath sounds normal.  Neurological: He is alert and oriented to person, place, and time.  Skin: Skin is  warm and dry.  Psychiatric: He has a normal mood and affect.     Lab Results  Component Value Date   WBC 5.7 05/26/2015   HGB 13.7 05/26/2015   HCT 40.1 05/26/2015   MCV 82.2 05/26/2015   PLT 218 05/26/2015   Lab Results  Component Value Date   CREATININE 1.07 05/26/2015   BUN 11 05/26/2015   NA 138 05/26/2015   K 4.6  05/26/2015   CL 105 05/26/2015   CO2 21* 05/26/2015    No results found for: HGBA1C Lipid Panel  No results found for: CHOL, TRIG, HDL, CHOLHDL, VLDL, LDLCALC     Assessment and plan:   Steven Lane was seen today for follow-up.  Diagnoses and all orders for this visit:  Vasovagal syncope I believe syncopal episode was vasovagal due to event happening after he had bowel movement. Patient slightly bradycardic but this is WNL for him.  I have advised that he stays hydrated Explained signs and symptoms that should warrant immediate attention.  Patient verbalized understanding with teach back used.  Essential hypertension -     amLODipine (NORVASC) 10 MG tablet; Take 1 tablet (10 mg total) by mouth daily. Needs office visit for refills -     lisinopril (PRINIVIL,ZESTRIL) 20 MG tablet; Take 1 tablet (20 mg total) by mouth daily. Patient blood pressure is stable and may continue on current medication.  Education on diet, exercise, and modifiable risk factors discussed. Will obtain appropriate labs as needed. Will follow up in 3-6 months.    Return in about 3 months (around 09/03/2015) for Hypertension.       Ambrose Finland, NP-C [facility] [phone] 06/06/2015, 2:09 PM

## 2015-06-06 NOTE — Progress Notes (Signed)
Patient is here for follow up from the ED Patient had a syncope episode and collapsed Patient stated this is the first time this has happened

## 2015-06-28 MED FILL — LISINOPRIL 20 MG TABLET: 20 | 30 days supply | Qty: 30 | Fill #0

## 2015-07-08 ENCOUNTER — Other Ambulatory Visit: Payer: Self-pay | Admitting: Internal Medicine

## 2015-07-08 MED FILL — ?TERAZOSIN 5 MG CAPSULE: 5 | 30 days supply | Qty: 30 | Fill #0

## 2015-07-08 MED FILL — ?AMLODIPINE BESYLATE 10 MG: 10 | 30 days supply | Qty: 30 | Fill #0

## 2015-07-22 ENCOUNTER — Ambulatory Visit: Payer: Self-pay | Attending: Internal Medicine

## 2015-08-01 MED FILL — ?TERAZOSIN 5 MG CAPSULE: 5 | 30 days supply | Qty: 30 | Fill #1

## 2015-08-01 MED FILL — AMLODIPINE BESYLATE 10 MG T: 10 | 30 days supply | Qty: 30 | Fill #1

## 2015-08-01 MED FILL — LISINOPRIL 20 MG TABLET: 20 | 30 days supply | Qty: 30 | Fill #1

## 2015-08-30 MED FILL — AMLODIPINE BESYLATE 10 MG T: 10 | 30 days supply | Qty: 30 | Fill #2

## 2015-08-30 MED FILL — TERAZOSIN 5 MG CAPSULE: 5 | 30 days supply | Qty: 30 | Fill #2

## 2015-08-30 MED FILL — ?LISINOPRIL 20 MG TABLET: 20 | 30 days supply | Qty: 30 | Fill #2

## 2015-10-01 MED FILL — LISINOPRIL 20 MG TABLET: 20 | 30 days supply | Qty: 30 | Fill #3

## 2015-10-01 MED FILL — AMLODIPINE BESYLATE 10 MG T: 10 | 30 days supply | Qty: 30 | Fill #3

## 2015-10-11 MED FILL — TERAZOSIN 5 MG CAPSULE: 5 | 30 days supply | Qty: 30 | Fill #3

## 2015-10-29 ENCOUNTER — Other Ambulatory Visit: Payer: Self-pay | Admitting: Internal Medicine

## 2015-10-29 MED FILL — AMLODIPINE BESYLATE 10 MG T: 10 | 30 days supply | Qty: 30 | Fill #0

## 2015-10-29 MED FILL — ?LISINOPRIL 20 MG TABLET: 20 | 30 days supply | Qty: 30 | Fill #0

## 2015-11-14 ENCOUNTER — Other Ambulatory Visit: Payer: Self-pay | Admitting: Internal Medicine

## 2015-11-14 MED FILL — ?TERAZOSIN 5 MG CAPSULE: 5 | 30 days supply | Qty: 30 | Fill #0

## 2015-11-15 ENCOUNTER — Other Ambulatory Visit: Payer: Self-pay | Admitting: Internal Medicine

## 2015-11-28 ENCOUNTER — Other Ambulatory Visit: Payer: Self-pay | Admitting: Internal Medicine

## 2015-11-28 MED FILL — ?LISINOPRIL 20 MG TABLET: 20 | 30 days supply | Qty: 30 | Fill #0

## 2015-11-28 MED FILL — AMLODIPINE BESYLATE 10 MG T: 10 | 30 days supply | Qty: 30 | Fill #0

## 2015-11-28 NOTE — Telephone Encounter (Signed)
Pt came into facility requesting refills for his meds:  amLODipine (NORVASC) 10 MG tablet  lisinopril (PRINIVIL,ZESTRIL) 20 MG  Please f/u

## 2015-12-12 ENCOUNTER — Other Ambulatory Visit: Payer: Self-pay | Admitting: Internal Medicine

## 2015-12-12 MED FILL — TERAZOSIN 5 MG CAPSULE: 5 | 30 days supply | Qty: 30 | Fill #0

## 2015-12-27 ENCOUNTER — Other Ambulatory Visit: Payer: Self-pay | Admitting: Internal Medicine

## 2015-12-30 ENCOUNTER — Other Ambulatory Visit: Payer: Self-pay | Admitting: Internal Medicine

## 2015-12-30 MED ORDER — LISINOPRIL 20 MG PO TABS: 20.0000 mg | ORAL_TABLET | Freq: Every day | ORAL | 0 refills | Status: DC

## 2015-12-30 MED FILL — AMLODIPINE BESYLATE 10 MG T: 10 | 30 days supply | Qty: 30 | Fill #0

## 2015-12-30 MED FILL — ?LISINOPRIL 20 MG TABLET: 20 | 30 days supply | Qty: 30 | Fill #0

## 2016-01-07 ENCOUNTER — Other Ambulatory Visit: Payer: Self-pay | Admitting: Internal Medicine

## 2016-01-07 MED FILL — TERAZOSIN 5 MG CAPSULE: 5 | 30 days supply | Qty: 30 | Fill #0

## 2016-01-08 ENCOUNTER — Ambulatory Visit: Payer: Self-pay | Attending: Internal Medicine | Admitting: Internal Medicine

## 2016-01-08 ENCOUNTER — Encounter: Payer: Self-pay | Admitting: Internal Medicine

## 2016-01-08 VITALS — BP 145/82 | HR 65 | Temp 97.3°F | Wt 167.0 lb

## 2016-01-08 DIAGNOSIS — K219 Gastro-esophageal reflux disease without esophagitis: Secondary | ICD-10-CM | POA: Insufficient documentation

## 2016-01-08 DIAGNOSIS — Z23 Encounter for immunization: Secondary | ICD-10-CM | POA: Insufficient documentation

## 2016-01-08 DIAGNOSIS — N400 Enlarged prostate without lower urinary tract symptoms: Secondary | ICD-10-CM | POA: Insufficient documentation

## 2016-01-08 DIAGNOSIS — Z7982 Long term (current) use of aspirin: Secondary | ICD-10-CM | POA: Insufficient documentation

## 2016-01-08 DIAGNOSIS — I10 Essential (primary) hypertension: Secondary | ICD-10-CM | POA: Insufficient documentation

## 2016-01-08 DIAGNOSIS — Z1329 Encounter for screening for other suspected endocrine disorder: Secondary | ICD-10-CM

## 2016-01-08 DIAGNOSIS — F17200 Nicotine dependence, unspecified, uncomplicated: Secondary | ICD-10-CM | POA: Insufficient documentation

## 2016-01-08 LAB — BASIC METABOLIC PANEL WITHOUT GFR
BUN: 12 mg/dL (ref 7–25)
CO2: 22 mmol/L (ref 20–31)
Calcium: 9.3 mg/dL (ref 8.6–10.3)
Chloride: 103 mmol/L (ref 98–110)
Creat: 0.98 mg/dL (ref 0.70–1.18)
GFR, Est African American: 86 mL/min
GFR, Est Non African American: 75 mL/min
Glucose, Bld: 85 mg/dL (ref 65–99)
Potassium: 4 mmol/L (ref 3.5–5.3)
Sodium: 136 mmol/L (ref 135–146)

## 2016-01-08 LAB — LIPID PANEL
Cholesterol: 257 mg/dL — ABNORMAL HIGH (ref 125–200)
HDL: 55 mg/dL (ref >=40)
LDL Cholesterol: 171 mg/dL — ABNORMAL HIGH (ref <130)
Total CHOL/HDL Ratio: 4.7 Ratio (ref <=5.0)
Triglycerides: 154 mg/dL — ABNORMAL HIGH (ref <150)
VLDL: 31 mg/dL — ABNORMAL HIGH (ref <30)

## 2016-01-08 MED ORDER — AMLODIPINE BESYLATE 10 MG PO TABS: 10.0000 mg | ORAL_TABLET | Freq: Every day | ORAL | 2 refills | Status: DC

## 2016-01-08 MED ORDER — TERAZOSIN HCL 5 MG PO CAPS: 5.0000 mg | ORAL_CAPSULE | Freq: Every day | ORAL | 2 refills | Status: DC

## 2016-01-08 MED ORDER — LISINOPRIL 20 MG PO TABS: 20.0000 mg | ORAL_TABLET | Freq: Every day | ORAL | 2 refills | Status: DC

## 2016-01-08 MED ORDER — ASPIRIN 81 MG PO TABS: 81.0000 mg | ORAL_TABLET | Freq: Every day | ORAL | 2 refills | Status: AC

## 2016-01-08 NOTE — Progress Notes (Signed)
Steven Lane, is a 75 y.o. male  ZOX:[id number]  [id number]  DOB - [birth year]  CC:  Chief Complaint  Patient presents with  . Establish Care       HPI: Steven JustinBenoit Garabedian is a 75 y.o. male here today to establish medical care, last seen 2/17 in clinic, w/ pmhx of htn, gerd, bph. Pt w/o complaints today, eating healthy for most part.  Drinks occasionally etoh, smokes occasional cigar/cigarrette w/ friends, but raraly.   Patient has No headache, No chest pain, No abdominal pain - No Nausea, No new weakness tingling or numbness, No Cough - SOB.  Denies doe/presyncope/syncope/pnd/le edema.  bms normal, denies any hematemesis, brbpr, unintentional weightloss/gain    Review of Systems: Per HPI, o/w all systems reviewed and negative.   No Known Allergies Past Medical History:  Diagnosis Date  . HTN (hypertension)   . Hypertension    Current Outpatient Prescriptions on File Prior to Visit  Medication Sig Dispense Refill  . amLODipine (NORVASC) 10 MG tablet TAKE 1 TABLET BY MOUTH DAILY. 30 tablet 0  . aspirin 81 MG tablet Take 81 mg by mouth daily.    Marland Kitchen. lisinopril (PRINIVIL,ZESTRIL) 20 MG tablet Take 1 tablet (20 mg total) by mouth daily. 30 tablet 0  . Omeprazole (PRILOSEC PO) Take 1 tablet by mouth daily.     Marland Kitchen. terazosin (HYTRIN) 5 MG capsule TAKE 1 CAPSULE BY MOUTH AT BEDTIME. 30 capsule 0  . Dextromethorphan-Guaifenesin (ROBITUSSIN DM PO) Take 10 mLs by mouth daily as needed. For cough     No current facility-administered medications on file prior to visit.    No family history on file. Social History   Social History  . Marital status: Married    Spouse name: N/A  . Number of children: N/A  . Years of education: N/A   Occupational History  . Not on file.   Social History Main Topics  . Smoking status: Never Smoker  . Smokeless tobacco: Not on file  . Alcohol use Yes     Comment: occ  . Drug use: No  . Sexual activity: Not on file   Other Topics Concern  .  Not on file   Social History Narrative  . No narrative on file    Objective:   Vitals:   01/08/16 1157  BP: (!) 145/82  Pulse: 65  Temp: 97.3 F (36.3 C)    Filed Weights   01/08/16 1157  Weight: 167 lb (75.8 kg)    BP Readings from Last 3 Encounters:  01/08/16 (!) 145/82  06/06/15 115/74  05/26/15 147/80    Physical Exam: Constitutional: Patient appears well-developed and well-nourished. No distress. AAOx3, pleasant HENT: Normocephalic, atraumatic, External right and left ear normal. Oropharynx is clear and moist. bilat TMs clear  Eyes: Conjunctivae and EOM are normal. PERRL, no scleral icterus. Neck: Normal ROM. Neck supple. No JVD. No tracheal deviation. No thyromegaly. CVS: RRR, S1/S2 +, no murmurs, no gallops, no carotid bruit.  Pulmonary: Effort and breath sounds normal, no stridor, rhonchi, wheezes, rales.  Abdominal: Soft. BS +, no distension, tenderness, rebound or guarding.  Musculoskeletal: Normal range of motion. No edema and no tenderness.  LE: bilat/ no c/c/e, pulses 2+ bilateral. Neuro: Alert.muscle tone coordination wnl. No cranial nerve deficit grossly. Skin: Skin is warm and dry. No rash noted. Not diaphoretic. No erythema. No pallor. Psychiatric: Normal mood and affect. Behavior, judgment, thought content normal.  Lab Results  Component Value Date   WBC 5.7 05/26/2015  HGB 13.7 05/26/2015   HCT 40.1 05/26/2015   MCV 82.2 05/26/2015   PLT 218 05/26/2015   Lab Results  Component Value Date   CREATININE 1.07 05/26/2015   BUN 11 05/26/2015   NA 138 05/26/2015   K 4.6 05/26/2015   CL 105 05/26/2015   CO2 21 (L) 05/26/2015    No results found for: HGBA1C Lipid Panel  No results found for: CHOL, TRIG, HDL, CHOLHDL, VLDL, LDLCALC     Depression screen [facility] 2/9 01/08/2016 12/04/2014  Decreased Interest 0 0  Down, Depressed, Hopeless 0 0  PHQ - 2 Score 0 0    Assessment and plan:   1. HTN (hypertension), benign -within new guidelines goal  <150/90 for age, low salt diet discussed today, recd more walking, less smoking. - lowering bp more would risk presyncope again I suspect. - BASIC METABOLIC PANEL WITH GFR - Lipid Panel - same rx, no changes,. - renewed all rx  2. Thyroid disorder screen - TSH  3. Flu vaccine need - Flu Vaccine QUAD 36+ mos PF IM (Fluarix & Fluzone Quad PF)  4. tdap today  5. bph Doing well on hytrin, renewed  Return in about 3 months (around 04/08/2016), or if symptoms worsen or fail to improve.  The patient was given clear instructions to go to ER or return to medical center if symptoms don't improve, worsen or new problems develop. The patient verbalized understanding. The patient was told to call to get lab results if they haven't heard anything in the next week.    This note has been created with Education officer, environmental. Any transcriptional errors are unintentional.   Pete Glatter, MD, MBA/MHA [facility] [place], [place] [phone]   01/08/2016, 12:36 PM

## 2016-01-08 NOTE — Patient Instructions (Signed)
Hypertension Hypertension is another name for high blood pressure. High blood pressure forces your heart to work harder to pump blood. A blood pressure reading has two numbers, which includes a higher number over a lower number (example: 110/72). HOME CARE   Have your blood pressure rechecked by your doctor.  Only take medicine as told by your doctor. Follow the directions carefully. The medicine does not work as well if you skip doses. Skipping doses also puts you at risk for problems.  Do not smoke.  Monitor your blood pressure at home as told by your doctor. GET HELP IF:  You think you are having a reaction to the medicine you are taking.  You have repeat headaches or feel dizzy.  You have puffiness (swelling) in your ankles.  You have trouble with your vision. GET HELP RIGHT AWAY IF:   You get a very bad headache and are confused.  You feel weak, numb, or faint.  You get chest or belly (abdominal) pain.  You throw up (vomit).  You cannot breathe very well. MAKE SURE YOU:   Understand these instructions.  Will watch your condition.  Will get help right away if you are not doing well or get worse.   This information is not intended to replace advice given to you by your health care provider. Make sure you discuss any questions you have with your health care provider.   Document Released: 10/07/2007 Document Revised: 04/25/2013 Document Reviewed: 02/10/2013 Elsevier Interactive Patient Education 2016 Elsevier Inc.  - DASH Eating Plan DASH stands for "Dietary Approaches to Stop Hypertension." The DASH eating plan is a healthy eating plan that has been shown to reduce high blood pressure (hypertension). Additional health benefits may include reducing the risk of type 2 diabetes mellitus, heart disease, and stroke. The DASH eating plan may also help with weight loss. WHAT DO I NEED TO KNOW ABOUT THE DASH EATING PLAN? For the DASH eating plan, you will follow these  general guidelines:  Choose foods with a percent daily value for sodium of less than 5% (as listed on the food label).  Use salt-free seasonings or herbs instead of table salt or sea salt.  Check with your health care provider or pharmacist before using salt substitutes.  Eat lower-sodium products, often labeled as "lower sodium" or "no salt added."  Eat fresh foods.  Eat more vegetables, fruits, and low-fat dairy products.  Choose whole grains. Look for the word "whole" as the first word in the ingredient list.  Choose fish and skinless chicken or turkey more often than red meat. Limit fish, poultry, and meat to 6 oz (170 g) each day.  Limit sweets, desserts, sugars, and sugary drinks.  Choose heart-healthy fats.  Limit cheese to 1 oz (28 g) per day.  Eat more home-cooked food and less restaurant, buffet, and fast food.  Limit fried foods.  Cook foods using methods other than frying.  Limit canned vegetables. If you do use them, rinse them well to decrease the sodium.  When eating at a restaurant, ask that your food be prepared with less salt, or no salt if possible. WHAT FOODS CAN I EAT? Seek help from a dietitian for individual calorie needs. Grains Whole grain or whole wheat bread. Lovick rice. Whole grain or whole wheat pasta. Quinoa, bulgur, and whole grain cereals. Low-sodium cereals. Corn or whole wheat flour tortillas. Whole grain cornbread. Whole grain crackers. Low-sodium crackers. Vegetables Fresh or frozen vegetables (raw, steamed, roasted, or grilled). Low-sodium or   reduced-sodium tomato and vegetable juices. Low-sodium or reduced-sodium tomato sauce and paste. Low-sodium or reduced-sodium canned vegetables.  Fruits All fresh, canned (in natural juice), or frozen fruits. Meat and Other Protein Products Ground beef (85% or leaner), grass-fed beef, or beef trimmed of fat. Skinless chicken or turkey. Ground chicken or turkey. Pork trimmed of fat. All fish and  seafood. Eggs. Dried beans, peas, or lentils. Unsalted nuts and seeds. Unsalted canned beans. Dairy Low-fat dairy products, such as skim or 1% milk, 2% or reduced-fat cheeses, low-fat ricotta or cottage cheese, or plain low-fat yogurt. Low-sodium or reduced-sodium cheeses. Fats and Oils Tub margarines without trans fats. Light or reduced-fat mayonnaise and salad dressings (reduced sodium). Avocado. Safflower, olive, or canola oils. Natural peanut or almond butter. Other Unsalted popcorn and pretzels. The items listed above may not be a complete list of recommended foods or beverages. Contact your dietitian for more options. WHAT FOODS ARE NOT RECOMMENDED? Grains White bread. White pasta. White rice. Refined cornbread. Bagels and croissants. Crackers that contain trans fat. Vegetables Creamed or fried vegetables. Vegetables in a cheese sauce. Regular canned vegetables. Regular canned tomato sauce and paste. Regular tomato and vegetable juices. Fruits Dried fruits. Canned fruit in light or heavy syrup. Fruit juice. Meat and Other Protein Products Fatty cuts of meat. Ribs, chicken wings, bacon, sausage, bologna, salami, chitterlings, fatback, hot dogs, bratwurst, and packaged luncheon meats. Salted nuts and seeds. Canned beans with salt. Dairy Whole or 2% milk, cream, half-and-half, and cream cheese. Whole-fat or sweetened yogurt. Full-fat cheeses or blue cheese. Nondairy creamers and whipped toppings. Processed cheese, cheese spreads, or cheese curds. Condiments Onion and garlic salt, seasoned salt, table salt, and sea salt. Canned and packaged gravies. Worcestershire sauce. Tartar sauce. Barbecue sauce. Teriyaki sauce. Soy sauce, including reduced sodium. Steak sauce. Fish sauce. Oyster sauce. Cocktail sauce. Horseradish. Ketchup and mustard. Meat flavorings and tenderizers. Bouillon cubes. Hot sauce. Tabasco sauce. Marinades. Taco seasonings. Relishes. Fats and Oils Butter, stick margarine,  lard, shortening, ghee, and bacon fat. Coconut, palm kernel, or palm oils. Regular salad dressings. Other Pickles and olives. Salted popcorn and pretzels. The items listed above may not be a complete list of foods and beverages to avoid. Contact your dietitian for more information. WHERE CAN I FIND MORE INFORMATION? National Heart, Lung, and Blood Institute: www.nhlbi.nih.gov/health/health-topics/topics/dash/   This information is not intended to replace advice given to you by your health care provider. Make sure you discuss any questions you have with your health care provider.   Document Released: 04/09/2011 Document Revised: 05/11/2014 Document Reviewed: 02/22/2013 Elsevier Interactive Patient Education 2016 Elsevier Inc.    

## 2016-01-09 ENCOUNTER — Other Ambulatory Visit: Payer: Self-pay | Admitting: Internal Medicine

## 2016-01-09 LAB — TSH: TSH: 1.43 m[IU]/L (ref 0.40–4.50)

## 2016-01-09 MED ORDER — ATORVASTATIN CALCIUM 10 MG PO TABS: 10.0000 mg | ORAL_TABLET | Freq: Every day | ORAL | 3 refills | Status: DC

## 2016-01-09 MED FILL — ?ATORVASTATIN 10 MG TABLET: 10 | 30 days supply | Qty: 30 | Fill #0

## 2016-02-04 ENCOUNTER — Ambulatory Visit: Payer: Medicaid Other | Attending: Family Medicine | Admitting: Family Medicine

## 2016-02-04 ENCOUNTER — Encounter: Payer: Self-pay | Admitting: Family Medicine

## 2016-02-04 VITALS — BP 148/79 | HR 57 | Temp 98.2°F | Ht 67.0 in | Wt 169.0 lb

## 2016-02-04 DIAGNOSIS — Z79899 Other long term (current) drug therapy: Secondary | ICD-10-CM | POA: Insufficient documentation

## 2016-02-04 DIAGNOSIS — Z7982 Long term (current) use of aspirin: Secondary | ICD-10-CM | POA: Insufficient documentation

## 2016-02-04 DIAGNOSIS — I10 Essential (primary) hypertension: Secondary | ICD-10-CM | POA: Insufficient documentation

## 2016-02-04 DIAGNOSIS — R21 Rash and other nonspecific skin eruption: Secondary | ICD-10-CM | POA: Insufficient documentation

## 2016-02-04 DIAGNOSIS — L304 Erythema intertrigo: Secondary | ICD-10-CM | POA: Diagnosis not present

## 2016-02-04 MED ORDER — CLOTRIMAZOLE 1 % EX CREA: 1.0000 "application " | TOPICAL_CREAM | Freq: Two times a day (BID) | CUTANEOUS | 1 refills | Status: DC

## 2016-02-04 MED FILL — ?LISINOPRIL 20 MG TABLET: 20 | 30 days supply | Qty: 30 | Fill #0

## 2016-02-04 MED FILL — AMLODIPINE BESYLATE 10 MG T: 10 | 30 days supply | Qty: 30 | Fill #0

## 2016-02-04 MED FILL — ?ATORVASTATIN 10 MG TABLET: 10 | 30 days supply | Qty: 30 | Fill #1

## 2016-02-04 MED FILL — TERAZOSIN 5 MG CAPSULE: 5 | 30 days supply | Qty: 30 | Fill #0

## 2016-02-04 NOTE — Progress Notes (Signed)
Groin and left axilla rash- present for one week.

## 2016-02-04 NOTE — Patient Instructions (Signed)
Intertrigo Intertrigo is a skin condition that occurs in between folds of skin in places on the body that rub together a lot and do not get much ventilation. It is caused by heat, moisture, friction, sweat retention, and lack of air circulation, which produces red, irritated patches and, sometimes, scaling or drainage. People who have diabetes, who are obese, or who have treatment with antibiotics are at increased risk for intertrigo. The most common sites for intertrigo to occur include:  The groin.  The breasts.  The armpits.  Folds of abdominal skin.  Webbed spaces between the fingers or toes. Intertrigo may be aggravated by:  Sweat.  Feces.  Yeast or bacteria that are present near skin folds.  Urine.  Vaginal discharge. HOME CARE INSTRUCTIONS  The following steps can be taken to reduce friction and keep the affected area cool and dry:  Expose skin folds to the air.  Keep deep skin folds separated with cotton or linen cloth. Avoid tight fitting clothing that could cause chafing.  Wear open-toed shoes or sandals to help reduce moisture between the toes.  Apply absorbent powders to affected areas as directed by your caregiver.  Apply over-the-counter barrier pastes, such as zinc oxide, as directed by your caregiver.  If you develop a fungal infection in the affected area, your caregiver may have you use antifungal creams. SEEK MEDICAL CARE IF:   The rash is not improving after 1 week of treatment.  The rash is getting worse (more red, more swollen, more painful, or spreading).  You have a fever or chills. MAKE SURE YOU:   Understand these instructions.  Will watch your condition.  Will get help right away if you are not doing well or get worse.   This information is not intended to replace advice given to you by your health care provider. Make sure you discuss any questions you have with your health care provider.   Document Released: 04/20/2005 Document  Revised: 07/13/2011 Document Reviewed: 10/22/2014 Elsevier Interactive Patient Education 2016 Elsevier Inc.  

## 2016-02-04 NOTE — Progress Notes (Signed)
Subjective:  Patient ID: Steven Lane, male    DOB: 1940/08/06  Age: 75 y.o. MRN: [id number]  CC: Rash (groin area and under left arm)   HPI Steven Lane is a 75 year old male with a history of hypertension, BPH, hyperlipidemia who presents complaining of a one-week history of pruritic rash in his left on her arm and groin. He denies use of deodorants and has no allergy to any soaps or creams. He has no fever and denies the presence of similar rash in other body parts.  Past Medical History:  Diagnosis Date  . HTN (hypertension)   . Hypertension     History reviewed. No pertinent surgical history.  No Known Allergies   Outpatient Medications Prior to Visit  Medication Sig Dispense Refill  . amLODipine (NORVASC) 10 MG tablet Take 1 tablet (10 mg total) by mouth daily. 90 tablet 2  . aspirin 81 MG tablet Take 1 tablet (81 mg total) by mouth daily. 90 tablet 2  . atorvastatin (LIPITOR) 10 MG tablet Take 1 tablet (10 mg total) by mouth daily. 90 tablet 3  . lisinopril (PRINIVIL,ZESTRIL) 20 MG tablet Take 1 tablet (20 mg total) by mouth daily. 90 tablet 2  . terazosin (HYTRIN) 5 MG capsule Take 1 capsule (5 mg total) by mouth at bedtime. 90 capsule 2  . Dextromethorphan-Guaifenesin (ROBITUSSIN DM PO) Take 10 mLs by mouth daily as needed. For cough    . Omeprazole (PRILOSEC PO) Take 1 tablet by mouth daily.      No facility-administered medications prior to visit.     ROS Review of Systems  Constitutional: Negative for activity change and appetite change.  HENT: Negative for sinus pressure and sore throat.   Respiratory: Negative for chest tightness, shortness of breath and wheezing.   Cardiovascular: Negative for chest pain and palpitations.  Gastrointestinal: Negative for abdominal distention, abdominal pain and constipation.  Genitourinary: Negative.   Musculoskeletal: Negative.   Skin:       See hpi  Psychiatric/Behavioral: Negative for behavioral problems and dysphoric  mood.    Objective:  BP (!) 148/79 (BP Location: Right Arm, Patient Position: Sitting, Cuff Size: Large)   Pulse (!) 57   Temp 98.2 F (36.8 C) (Oral)   Ht 5\' 7"  (1.702 m)   Wt 169 lb (76.7 kg)   SpO2 96%   BMI 26.47 kg/m   BP/Weight 02/04/2016 01/08/2016 06/06/2015  Systolic BP 148 145 115  Diastolic BP 79 82 74  Wt. (Lbs) 169 167 165  BMI 26.47 26.95 26.64      Physical Exam  Constitutional: He is oriented to person, place, and time. He appears well-developed and well-nourished.  Cardiovascular: Normal rate, normal heart sounds and intact distal pulses.   No murmur heard. Pulmonary/Chest: Effort normal and breath sounds normal. He has no wheezes. He has no rales. He exhibits no tenderness.  Abdominal: Soft. Bowel sounds are normal. He exhibits no distension and no mass. There is no tenderness.  Musculoskeletal: Normal range of motion.  Neurological: He is alert and oriented to person, place, and time.  Skin:  Erythematous rash in crural folds and medial aspect of thighs bilaterally and in left axilla     Assessment & Plan:   1. Intertrigo Advised to wear loose clothing at home to allow proper aeration. - clotrimazole (LOTRIMIN) 1 % cream; Apply 1 application topically 2 (two) times daily.  Dispense: 60 g; Refill: 1   Meds ordered this encounter  Medications  . clotrimazole (LOTRIMIN)  1 % cream    Sig: Apply 1 application topically 2 (two) times daily.    Dispense:  60 g    Refill:  1    Follow-up: Return in about 2 weeks (around 02/18/2016) for Follow-up of intertrigo with PCP- Dr Julien NordmannLangeland.   Jaclyn ShaggyEnobong Amao MD

## 2016-02-07 ENCOUNTER — Emergency Department (HOSPITAL_COMMUNITY)
Admission: EM | Admit: 2016-02-07 | Discharge: 2016-02-07 | Disposition: A | Discharge status: Home / Self Care | Payer: Self-pay | Attending: Emergency Medicine | Admitting: Emergency Medicine

## 2016-02-07 ENCOUNTER — Encounter (HOSPITAL_COMMUNITY): Payer: Self-pay | Admitting: Emergency Medicine

## 2016-02-07 DIAGNOSIS — B86 Scabies: Secondary | ICD-10-CM | POA: Insufficient documentation

## 2016-02-07 DIAGNOSIS — I10 Essential (primary) hypertension: Secondary | ICD-10-CM | POA: Insufficient documentation

## 2016-02-07 DIAGNOSIS — Z7982 Long term (current) use of aspirin: Secondary | ICD-10-CM | POA: Insufficient documentation

## 2016-02-07 DIAGNOSIS — F17290 Nicotine dependence, other tobacco product, uncomplicated: Secondary | ICD-10-CM | POA: Insufficient documentation

## 2016-02-07 MED ORDER — HYDROXYZINE HCL 25 MG PO TABS: 25.0000 mg | ORAL_TABLET | Freq: Four times a day (QID) | ORAL | 0 refills | Status: DC

## 2016-02-07 MED ORDER — PERMETHRIN 5 % EX CREA: 1.0000 "application " | TOPICAL_CREAM | Freq: Once | CUTANEOUS | 1 refills | Status: AC

## 2016-02-07 MED FILL — PERMETHRIN 5% CREAM: 5 | 30 days supply | Qty: 60 | Fill #0

## 2016-02-07 MED FILL — hydrOXYzine HCL 25 MG TABS: 25 | 3 days supply | Qty: 12 | Fill #0

## 2016-02-07 NOTE — ED Notes (Addendum)
C/O RASH IN BILATERAL AXILLA AND GROIN X 1 WEEK. STATES WIFE HAS RASH TOO. ASKED PT TO GET UNDRESSED INTO GOWN.

## 2016-02-07 NOTE — ED Triage Notes (Signed)
Onset one week ago developed rash and itching. [facility] and states not really helping. Here for evaluation and possibly new medication.

## 2016-02-07 NOTE — ED Provider Notes (Signed)
[facility]-EMERGENCY DEPT Provider Note   CSN: [id number] Arrival date & time: 02/07/16  1105  By signing my name below, I, Freida Busman, attest that this documentation has been prepared under the direction and in the presence of non-physician practitioner, Ersilia Brawley Waneta Martins, PA-C . Electronically Signed: Freida Busman, Scribe. 02/07/2016. 12:07 PM.  History   Chief Complaint Chief Complaint  Patient presents with  . Pruritis  . Rash    The history is provided by the patient. No language interpreter was used.     HPI Comments:  Steven Lane is a 75 y.o. male who presents to the Emergency Department complaining of a pruritic rash to his groin, creases of his BUE, and back x 1 week. The itching is worse at night. He has been evaluated for the rash and given clotrimazole which he has been using without relief. Pt states his wife has a similar rash. He denies h/o DM. Pt has no other complaints or symptoms at this time. Pt notes he has had a bug issue in the past and needed to get an exterminator.   Past Medical History:  Diagnosis Date  . HTN (hypertension)   . Hypertension     Patient Active Problem List   Diagnosis Date Noted  . HTN (hypertension) 12/04/2014  . Benign prostatic hyperplasia 12/04/2014    History reviewed. No pertinent surgical history.     Home Medications    Prior to Admission medications   Medication Sig Start Date End Date Taking? Authorizing Provider  amLODipine (NORVASC) 10 MG tablet Take 1 tablet (10 mg total) by mouth daily. 01/08/16   Pete Glatter, MD  aspirin 81 MG tablet Take 1 tablet (81 mg total) by mouth daily. 01/08/16   Pete Glatter, MD  atorvastatin (LIPITOR) 10 MG tablet Take 1 tablet (10 mg total) by mouth daily. 01/09/16   Pete Glatter, MD  clotrimazole (LOTRIMIN) 1 % cream Apply 1 application topically 2 (two) times daily. 02/04/16   Jaclyn Shaggy, MD  Dextromethorphan-Guaifenesin (ROBITUSSIN DM PO) Take 10 mLs by mouth daily as needed.  For cough    Historical Provider, MD  lisinopril (PRINIVIL,ZESTRIL) 20 MG tablet Take 1 tablet (20 mg total) by mouth daily. 01/08/16   Pete Glatter, MD  Omeprazole (PRILOSEC PO) Take 1 tablet by mouth daily.     Historical Provider, MD  terazosin (HYTRIN) 5 MG capsule Take 1 capsule (5 mg total) by mouth at bedtime. 01/08/16   Pete Glatter, MD    Family History No family history on file.  Social History Social History  Substance Use Topics  . Smoking status: Current Some Day Smoker    Types: Cigars  . Smokeless tobacco: Never Used     Comment: occasional  . Alcohol use 0.6 - 1.2 oz/week    1 - 2 Glasses of wine per week     Comment: with meals     Allergies   Review of patient's allergies indicates no known allergies.   Review of Systems Review of Systems  Constitutional: Negative for chills and fever.  Genitourinary: Negative for discharge, hematuria and penile pain.  Skin: Positive for rash.    Physical Exam Updated Vital Signs BP 114/60 (BP Location: Left Arm)   Pulse 65   Temp 97.7 F (36.5 C) (Oral)   Resp 18   Ht 5\' 7"  (1.702 m)   Wt 170 lb (77.1 kg)   SpO2 97%   BMI 26.63 kg/m   Physical Exam  Constitutional: He is oriented to person, place, and time. He appears well-developed and well-nourished. No distress.  HENT:  Head: Normocephalic and atraumatic.  Eyes: Conjunctivae are normal.  Cardiovascular: Normal rate.   Pulmonary/Chest: Effort normal.  Abdominal: He exhibits no distension.  Neurological: He is alert and oriented to person, place, and time.  Skin: Skin is warm and dry.  Red, fine, raised bumps noted in linear pattern to bilateral folds of forearms, bilateral axilla, bilateral inguinal creases, and gluteal cleft  Psychiatric: He has a normal mood and affect.  Nursing note and vitals reviewed.    ED Treatments / Results  DIAGNOSTIC STUDIES:  Oxygen Saturation is 97% on RA, normal by my interpretation.    COORDINATION OF  CARE:  11:55 AM Discussed treatment plan with pt at bedside and pt agreed to plan.  Labs (all labs ordered are listed, but only abnormal results are displayed) Labs Reviewed - No data to display  EKG  EKG Interpretation None       Radiology No results found.  Procedures Procedures (including critical care time)  Medications Ordered in ED Medications - No data to display   Initial Impression / Assessment and Plan / ED Course  I have reviewed the triage vital signs and the nursing notes.  Pertinent labs & imaging results that were available during my care of the patient were reviewed by me and considered in my medical decision making (see chart for details).  Clinical Course   Discussed diagnosis & treatment of scabies with patient.  They have been advised to followup with their primary care doctor 2 weeks after treatment.  They have also been advised to clean entire household including washing sheets in the hottest water possible and using R.I.D. spray in the car and on sofa. Discussed the proper application of permethrin cream.  Patient  advised to repeat treatment in one week. No signs of secondary infection.  Discussed return precautions. Patient advised to follow up with PCP for unresolved symptoms. Patient appears safe for discharge.    Final Clinical Impressions(s) / ED Diagnoses   Final diagnoses:  Scabies    New Prescriptions New Prescriptions   HYDROXYZINE (ATARAX/VISTARIL) 25 MG TABLET    Take 1 tablet (25 mg total) by mouth every 6 (six) hours.   PERMETHRIN (ACTICIN) 5 % CREAM    Apply 1 application topically once. Refill and repeat application in 1 week if still itching   I personally performed the services described in this documentation, which was scribed in my presence. The recorded information has been reviewed and is accurate.   Marieli Rudy F de [place] River[place], [place]vonne Andrew 02/07/16 1227    Lyndal Pulleyaniel Knott, MD 02/08/16 [phone]

## 2016-03-04 MED FILL — ?LISINOPRIL 20 MG TABLET: 20 | 30 days supply | Qty: 30 | Fill #1

## 2016-03-04 MED FILL — TERAZOSIN 5 MG CAPSULE: 5 | 30 days supply | Qty: 30 | Fill #1

## 2016-03-04 MED FILL — ?AMLODIPINE BESYLATE 10 MG: 10 | 30 days supply | Qty: 30 | Fill #1

## 2016-03-04 MED FILL — ATORVASTATIN 10 MG TABLET: 10 | 30 days supply | Qty: 30 | Fill #2

## 2016-04-08 MED FILL — ATORVASTATIN 10 MG TABLET: 10 | 30 days supply | Qty: 30 | Fill #3

## 2016-04-08 MED FILL — AMLODIPINE BESYLATE 10 MG T: 10 | 30 days supply | Qty: 30 | Fill #2

## 2016-04-08 MED FILL — LISINOPRIL 20 MG TABLET: 20 | 30 days supply | Qty: 30 | Fill #2

## 2016-04-08 MED FILL — TERAZOSIN 5 MG CAPSULE: 5 | 30 days supply | Qty: 30 | Fill #2

## 2016-05-07 MED FILL — ?AMLODIPINE BESYLATE 10 MG: 10 | 30 days supply | Qty: 30 | Fill #3

## 2016-05-07 MED FILL — LISINOPRIL 20 MG TABLET: 20 | 30 days supply | Qty: 30 | Fill #0

## 2016-05-11 MED FILL — ?ATORVASTATIN 10 MG TABLET: 10 | 30 days supply | Qty: 30 | Fill #4

## 2016-05-11 MED FILL — TERAZOSIN 5 MG CAPSULE: 5 | 30 days supply | Qty: 30 | Fill #3

## 2016-06-08 MED FILL — ?AMLODIPINE BESYLATE 10 MG: 10 | 30 days supply | Qty: 30 | Fill #4

## 2016-06-08 MED FILL — ?ATORVASTATIN 10 MG TABLET: 10 | 30 days supply | Qty: 30 | Fill #5

## 2016-06-08 MED FILL — LISINOPRIL 20 MG TABLET: 20 | 30 days supply | Qty: 30 | Fill #3

## 2016-06-08 MED FILL — TERAZOSIN 5 MG CAPSULE: 5 | 30 days supply | Qty: 30 | Fill #4

## 2016-07-08 MED FILL — LISINOPRIL 20 MG TABLET: 20 | 30 days supply | Qty: 30 | Fill #4

## 2016-07-08 MED FILL — TERAZOSIN 5 MG CAPSULE: 5 | 30 days supply | Qty: 30 | Fill #5

## 2016-07-08 MED FILL — ?AMLODIPINE BESYLATE 10 MG: 10 | 30 days supply | Qty: 30 | Fill #5

## 2016-07-28 MED FILL — ATORVASTATIN 10 MG TABLET: 10 | 30 days supply | Qty: 30 | Fill #6

## 2016-08-06 MED FILL — LISINOPRIL 20 MG TABLET: 20 | 30 days supply | Qty: 30 | Fill #5

## 2016-08-06 MED FILL — AMLODIPINE BESYLATE 10 MG T: 10 | 30 days supply | Qty: 30 | Fill #6

## 2016-08-10 ENCOUNTER — Other Ambulatory Visit: Payer: Self-pay | Admitting: Internal Medicine

## 2016-08-10 MED ORDER — HYDROXYZINE HCL 25 MG PO TABS: 25.0000 mg | ORAL_TABLET | Freq: Four times a day (QID) | ORAL | 0 refills | Status: DC

## 2016-08-10 MED FILL — PERMETHRIN 5% CREAM: 5 | 30 days supply | Qty: 60 | Fill #1

## 2016-08-10 MED FILL — TERAZOSIN 5 MG CAPSULE: 5 | 30 days supply | Qty: 30 | Fill #6

## 2016-08-10 MED FILL — hydrOXYzine HCL 25 MG TABS: 25 | 3 days supply | Qty: 12 | Fill #0

## 2016-08-31 MED FILL — ATORVASTATIN 10 MG TABLET: 10 | 30 days supply | Qty: 30 | Fill #7

## 2016-08-31 MED FILL — AMLODIPINE BESYLATE 10 MG T: 10 | 30 days supply | Qty: 30 | Fill #7

## 2016-08-31 MED FILL — TERAZOSIN 5 MG CAPSULE: 5 | 30 days supply | Qty: 30 | Fill #7

## 2016-08-31 MED FILL — LISINOPRIL 20 MG TABLET: 20 | 30 days supply | Qty: 30 | Fill #6

## 2016-09-17 ENCOUNTER — Encounter: Payer: Self-pay | Admitting: Internal Medicine

## 2016-09-30 ENCOUNTER — Ambulatory Visit: Payer: Self-pay | Attending: Internal Medicine

## 2016-09-30 MED FILL — TERAZOSIN 5 MG CAPSULE: 5 | 30 days supply | Qty: 30 | Fill #8

## 2016-09-30 MED FILL — ?LISINOPRIL 20 MG TABLET: 20 | 30 days supply | Qty: 30 | Fill #7

## 2016-09-30 MED FILL — ATORVASTATIN 10 MG TABLET: 10 | 30 days supply | Qty: 30 | Fill #8

## 2016-09-30 MED FILL — AMLODIPINE BESYLATE 10 MG T: 10 | 30 days supply | Qty: 30 | Fill #8

## 2016-10-01 ENCOUNTER — Encounter: Payer: Self-pay | Admitting: Internal Medicine

## 2016-10-01 ENCOUNTER — Ambulatory Visit: Payer: Self-pay | Attending: Internal Medicine | Admitting: Internal Medicine

## 2016-10-01 VITALS — BP 112/64 | HR 64 | Temp 97.4°F | Resp 16 | Ht 62.0 in | Wt 164.8 lb

## 2016-10-01 DIAGNOSIS — I10 Essential (primary) hypertension: Secondary | ICD-10-CM | POA: Insufficient documentation

## 2016-10-01 DIAGNOSIS — Z87891 Personal history of nicotine dependence: Secondary | ICD-10-CM | POA: Insufficient documentation

## 2016-10-01 DIAGNOSIS — Z23 Encounter for immunization: Secondary | ICD-10-CM | POA: Insufficient documentation

## 2016-10-01 DIAGNOSIS — M545 Low back pain, unspecified: Secondary | ICD-10-CM

## 2016-10-01 DIAGNOSIS — Z7982 Long term (current) use of aspirin: Secondary | ICD-10-CM | POA: Insufficient documentation

## 2016-10-01 DIAGNOSIS — N401 Enlarged prostate with lower urinary tract symptoms: Secondary | ICD-10-CM | POA: Insufficient documentation

## 2016-10-01 DIAGNOSIS — E785 Hyperlipidemia, unspecified: Secondary | ICD-10-CM | POA: Insufficient documentation

## 2016-10-01 DIAGNOSIS — M542 Cervicalgia: Secondary | ICD-10-CM | POA: Insufficient documentation

## 2016-10-01 MED ORDER — AMLODIPINE BESYLATE 10 MG PO TABS: 10.0000 mg | ORAL_TABLET | Freq: Every day | ORAL | 2 refills | Status: AC

## 2016-10-01 MED ORDER — DICLOFENAC SODIUM 1 % TD GEL: 2.0000 g | Freq: Four times a day (QID) | TRANSDERMAL | 2 refills | Status: AC

## 2016-10-01 MED ORDER — TERAZOSIN HCL 5 MG PO CAPS: 5.0000 mg | ORAL_CAPSULE | Freq: Every day | ORAL | 2 refills | Status: AC

## 2016-10-01 MED ORDER — ATORVASTATIN CALCIUM 10 MG PO TABS: 10.0000 mg | ORAL_TABLET | Freq: Every day | ORAL | 3 refills | Status: AC

## 2016-10-01 MED ORDER — LISINOPRIL 20 MG PO TABS: 20.0000 mg | ORAL_TABLET | Freq: Every day | ORAL | 2 refills | Status: AC

## 2016-10-01 MED FILL — DICLOFENAC SODIUM 1% GEL: 1 | 12 days supply | Qty: 100 | Fill #0

## 2016-10-01 NOTE — Patient Instructions (Addendum)
Use the topical pain rub on your neck and lower back as discussed.   Avoid excessive lifting. Follow up if symptoms do not resolve or gets worse.  Pneumococcal Conjugate Vaccine (PCV13) What You Need to Know  1. Why get vaccinated? Vaccination can protect both children and adults from pneumococcal disease. Pneumococcal disease is caused by bacteria that can spread from person to person through close contact. It can cause ear infections, and it can also lead to more serious infections of the:  Lungs (pneumonia),  Blood (bacteremia), and  Covering of the brain and spinal cord (meningitis).  Pneumococcal pneumonia is most common among adults. Pneumococcal meningitis can cause deafness and brain damage, and it kills about 1 child in 10 who get it. Anyone can get pneumococcal disease, but children under 54 years of age and adults 76 years and older, people with certain medical conditions, and cigarette smokers are at the highest risk. Before there was a vaccine, the [place] saw:  more than 700 cases of meningitis,  about 13,000 blood infections,  about 5 million ear infections, and  about 200 deaths  in children under 5 each year from pneumococcal disease. Since vaccine became available, severe pneumococcal disease in these children has fallen by 88%. About 18,000 older adults die of pneumococcal disease each year in the [place]. Treatment of pneumococcal infections with penicillin and other drugs is not as effective as it used to be, because some strains of the disease have become resistant to these drugs. This makes prevention of the disease, through vaccination, even more important. 2. PCV13 vaccine Pneumococcal conjugate vaccine (called PCV13) protects against 13 types of pneumococcal bacteria. PCV13 is routinely given to children at 2, 4, 6, and 15-20 months of age. It is also recommended for children and adults 54 to 83 years of age with certain health conditions, and for  all adults 57 years of age and older. Your doctor can give you details. 3. Some people should not get this vaccine Anyone who has ever had a life-threatening allergic reaction to a dose of this vaccine, to an earlier pneumococcal vaccine called PCV7, or to any vaccine containing diphtheria toxoid (for example, DTaP), should not get PCV13. Anyone with a severe allergy to any component of PCV13 should not get the vaccine. Tell your doctor if the person being vaccinated has any severe allergies. If the person scheduled for vaccination is not feeling well, your healthcare provider might decide to reschedule the shot on another day. 4. Risks of a vaccine reaction With any medicine, including vaccines, there is a chance of reactions. These are usually mild and go away on their own, but serious reactions are also possible. Problems reported following PCV13 varied by age and dose in the series. The most common problems reported among children were:  About half became drowsy after the shot, had a temporary loss of appetite, or had redness or tenderness where the shot was given.  About 1 out of 3 had swelling where the shot was given.  About 1 out of 3 had a mild fever, and about 1 in 20 had a fever over 102.80F.  Up to about 8 out of 10 became fussy or irritable.  Adults have reported pain, redness, and swelling where the shot was given; also mild fever, fatigue, headache, chills, or muscle pain. Young children who get PCV13 along with inactivated flu vaccine at the same time may be at increased risk for seizures caused by fever. Ask your doctor for  more information. Problems that could happen after any vaccine:  People sometimes faint after a medical procedure, including vaccination. Sitting or lying down for about 15 minutes can help prevent fainting, and injuries caused by a fall. Tell your doctor if you feel dizzy, or have vision changes or ringing in the ears.  Some older children and adults get  severe pain in the shoulder and have difficulty moving the arm where a shot was given. This happens very rarely.  Any medication can cause a severe allergic reaction. Such reactions from a vaccine are very rare, estimated at about 1 in a million doses, and would happen within a few minutes to a few hours after the vaccination. As with any medicine, there is a very small chance of a vaccine causing a serious injury or death. The safety of vaccines is always being monitored. For more information, visit: http://floyd.org/www.cdc.gov/vaccinesafety/ 5. What if there is a serious reaction? What should I look for? Look for anything that concerns you, such as signs of a severe allergic reaction, very high fever, or unusual behavior. Signs of a severe allergic reaction can include hives, swelling of the face and throat, difficulty breathing, a fast heartbeat, dizziness, and weakness-usually within a few minutes to a few hours after the vaccination. What should I do?  If you think it is a severe allergic reaction or other emergency that can't wait, call 9-1-1 or get the person to the nearest hospital. Otherwise, call your doctor.  Reactions should be reported to the Vaccine Adverse Event Reporting System (VAERS). Your doctor should file this report, or you can do it yourself through the VAERS web site at www.vaers.LAgents.nohhs.gov, or by calling 1-(251)001-2747. ? VAERS does not give medical advice. 6. The National Vaccine Injury Compensation Program The [employer] Vaccine Injury Compensation Program (VICP) is a federal program that was created to compensate people who may have been injured by certain vaccines. Persons who believe they may have been injured by a vaccine can learn about the program and about filing a claim by calling 1-640-791-5740 or visiting the VICP website at SpiritualWord.atwww.hrsa.gov/vaccinecompensation. There is a time limit to file a claim for compensation. 7. How can I learn more?  Ask your healthcare provider. He or she  can give you the vaccine package insert or suggest other sources of information.  Call your local or state health department.  Contact the Centers for Disease Control and Prevention (CDC): ? Call 367-030-97551-(520) 452-6050 (1-800-CDC-INFO) or ? Visit CDC's website at PicCapture.uywww.cdc.gov/vaccines Vaccine Information Statement, PCV13 Vaccine (03/08/2014) This information is not intended to replace advice given to you by your health care provider. Make sure you discuss any questions you have with your health care provider. Document Released: 02/15/2006 Document Revised: 01/09/2016 Document Reviewed: 01/09/2016 Elsevier Interactive Patient Education  2017 [employer]
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
## 2016-10-02 ENCOUNTER — Other Ambulatory Visit: Payer: Self-pay | Admitting: Internal Medicine

## 2016-10-02 DIAGNOSIS — R972 Elevated prostate specific antigen [PSA]: Secondary | ICD-10-CM

## 2016-10-02 LAB — CBC
HEMOGLOBIN: 13.4 g/dL (ref 13.0–17.7)
Hematocrit: 40.6 % (ref 37.5–51.0)
MCH: 27.3 pg (ref 26.6–33.0)
MCHC: 33 g/dL (ref 31.5–35.7)
MCV: 83 fL (ref 79–97)
PLATELETS: 270 10*3/uL (ref 150–379)
RBC: 4.91 x10E6/uL (ref 4.14–5.80)
RDW: 14.8 % (ref 12.3–15.4)
WBC: 4.9 10*3/uL (ref 3.4–10.8)

## 2016-10-02 LAB — PSA: Prostate Specific Ag, Serum: 6.6 ng/mL — ABNORMAL HIGH (ref 0.0–4.0)

## 2016-10-02 NOTE — Progress Notes (Signed)
Referred to urology for elevated PSA.

## 2016-10-28 MED FILL — AMLODIPINE BESYLATE 10 MG T: 10 | 30 days supply | Qty: 30 | Fill #0

## 2016-10-28 MED FILL — ?ATORVASTATIN 10 MG TABLET: 10 | 30 days supply | Qty: 30 | Fill #9

## 2016-10-28 MED FILL — ?LISINOPRIL 20 MG TABLET: 20 | 30 days supply | Qty: 30 | Fill #8

## 2016-11-13 MED FILL — TERAZOSIN 5 MG CAPSULE: 5 | 30 days supply | Qty: 30 | Fill #0

## 2016-11-24 MED FILL — ?ATORVASTATIN 10 MG TABLET: 10 | 30 days supply | Qty: 30 | Fill #10

## 2016-11-24 MED FILL — AMLODIPINE BESYLATE 10 MG T: 10 | 30 days supply | Qty: 30 | Fill #1

## 2016-11-24 MED FILL — LISINOPRIL 20 MG TAB: 20 | 30 days supply | Qty: 30 | Fill #0

## 2016-12-14 MED FILL — TERAZOSIN 5 MG CAPSULE: 5 | 30 days supply | Qty: 30 | Fill #1

## 2016-12-29 MED FILL — AMLODIPINE BESYLATE 10 MG T: 10 | 30 days supply | Qty: 30 | Fill #2

## 2016-12-29 MED FILL — LISINOPRIL 20 MG TAB: 20 | 30 days supply | Qty: 30 | Fill #1

## 2016-12-29 MED FILL — ?ATORVASTATIN 10 MG TABLET: 10 | 30 days supply | Qty: 30 | Fill #11

## 2017-01-05 ENCOUNTER — Ambulatory Visit: Payer: Self-pay | Attending: Internal Medicine | Admitting: Internal Medicine

## 2017-01-05 ENCOUNTER — Encounter: Payer: Self-pay | Admitting: Internal Medicine

## 2017-01-05 VITALS — BP 129/77 | HR 65 | Temp 97.8°F | Ht 62.0 in | Wt 166.0 lb

## 2017-01-05 DIAGNOSIS — E785 Hyperlipidemia, unspecified: Secondary | ICD-10-CM | POA: Insufficient documentation

## 2017-01-05 DIAGNOSIS — K219 Gastro-esophageal reflux disease without esophagitis: Secondary | ICD-10-CM | POA: Insufficient documentation

## 2017-01-05 DIAGNOSIS — Z7982 Long term (current) use of aspirin: Secondary | ICD-10-CM | POA: Insufficient documentation

## 2017-01-05 DIAGNOSIS — R972 Elevated prostate specific antigen [PSA]: Secondary | ICD-10-CM

## 2017-01-05 DIAGNOSIS — Z23 Encounter for immunization: Secondary | ICD-10-CM | POA: Insufficient documentation

## 2017-01-05 DIAGNOSIS — I10 Essential (primary) hypertension: Secondary | ICD-10-CM | POA: Insufficient documentation

## 2017-01-05 DIAGNOSIS — Z87891 Personal history of nicotine dependence: Secondary | ICD-10-CM | POA: Insufficient documentation

## 2017-01-05 DIAGNOSIS — L299 Pruritus, unspecified: Secondary | ICD-10-CM | POA: Insufficient documentation

## 2017-01-05 DIAGNOSIS — N400 Enlarged prostate without lower urinary tract symptoms: Secondary | ICD-10-CM | POA: Insufficient documentation

## 2017-01-05 DIAGNOSIS — H9191 Unspecified hearing loss, right ear: Secondary | ICD-10-CM

## 2017-01-05 MED ORDER — PERMETHRIN 5 % EX CREA: TOPICAL_CREAM | CUTANEOUS | 1 refills | Status: DC

## 2017-01-05 MED FILL — ?TERAZOSIN 5 MG CAPSULE: 5 | 30 days supply | Qty: 30 | Fill #2

## 2017-01-05 MED FILL — PERMETHRIN 5% CREAM: 5 | 7 days supply | Qty: 30 | Fill #0

## 2017-01-05 NOTE — Progress Notes (Signed)
Pt is having ear pain.

## 2017-01-05 NOTE — Progress Notes (Signed)
Patient ID: Steven Lane, male    DOB: November 22, 1940  MRN: [id number]  CC: Hypertension   Subjective: Steven Lane is a 76 y.o. male who presents for chronic ds management. [place]  His concerns today include:  Pt with hx of HTN, BPH, HL and GERD.  1. C/o generalized  itching (including axilla, scalp, groin)about 1 mth ago -no rash -wife has same but with notable red spots. Dx with scabies in 02/2016.  Went away with treatment. Feels same thing now -has not seen any bed bugs  2. HTN: compliant with meds and salt restriction -no CP/SOB/LE edema/HA  3. HL: compliant with Lipitor. No myalgias  4. C/o decreased hearing on RT side x 2 mths -intermittent humming sound in the air.  -uses Q-tip to clean; no pain.   5. BPH: passing urine ok Wakes up 1-2 x a nights to urinate.  -PSA 3 mths ago was 6.6  Patient Active Problem List   Diagnosis Date Noted  . HTN (hypertension) 12/04/2014  . Benign prostatic hyperplasia 12/04/2014     Current Outpatient Prescriptions on File Prior to Visit  Medication Sig Dispense Refill  . amLODipine (NORVASC) 10 MG tablet Take 1 tablet (10 mg total) by mouth daily. 90 tablet 2  . aspirin 81 MG tablet Take 1 tablet (81 mg total) by mouth daily. 90 tablet 2  . atorvastatin (LIPITOR) 10 MG tablet Take 1 tablet (10 mg total) by mouth daily. 90 tablet 3  . diclofenac sodium (VOLTAREN) 1 % GEL Apply 2 g topically 4 (four) times daily. 100 g 2  . lisinopril (PRINIVIL,ZESTRIL) 20 MG tablet Take 1 tablet (20 mg total) by mouth daily. 90 tablet 2  . terazosin (HYTRIN) 5 MG capsule Take 1 capsule (5 mg total) by mouth at bedtime. 90 capsule 2   No current facility-administered medications on file prior to visit.     No Known Allergies  Social History   Social History  . Marital status: Married    Spouse name: N/A  . Number of children: N/A  . Years of education: N/A   Occupational History  . Not on file.    Social History Main Topics  . Smoking status: Former Smoker    Types: Cigars    Quit date: 06/03/2006  . Smokeless tobacco: Former NeurosurgeonUser     Comment: occasional  . Alcohol use 0.6 - 1.2 oz/week    1 - 2 Glasses of wine per week     Comment: with meals  . Drug use: No  . Sexual activity: Yes    Partners: Female    Birth control/ protection: Condom   Other Topics Concern  . Not on file   Social History Narrative  . No narrative on file    No family history on file.  No past surgical history on file.  ROS: Review of Systems Neg except as stated above  PHYSICAL EXAM: BP 129/77   Pulse 65   Temp 97.8 F (36.6 C) (Oral)   Ht 5\' 2"  (1.575 m)   Wt 166 lb (75.3 kg)   SpO2 94%   BMI 30.36 kg/m   Wt Readings from Last 3 Encounters:  01/05/17 166 lb (75.3 kg)  10/01/16 164 lb 12.8 oz (74.8 kg)  02/07/16 170 lb (77.1 kg)    Physical Exam General appearance - alert, well appearing, and in no distress Mental status - alert, oriented to person, place, and time, normal mood, behavior,  speech, dress, motor activity, and thought processes Ears - bilateral TM's and external ear canals normal Neck - supple, no significant adenopathy Chest - clear to auscultation, no wheezes, rales or rhonchi, symmetric air entry Heart - normal rate, regular rhythm, normal S1, S2, no murmurs, rubs, clicks or gallops Extremities - peripheral pulses normal, no pedal edema, no clubbing or cyanosis Skin - slightly dry skin with scattered white spots (liver spots). No rash seen in axilla, on scalp or b/w webs  Results for orders placed or performed in visit on 10/01/16  PSA  Result Value Ref Range   Prostate Specific Ag, Serum 6.6 (H) 0.0 - 4.0 ng/mL  CBC  Result Value Ref Range   WBC 4.9 3.4 - 10.8 x10E3/uL   RBC 4.91 4.14 - 5.80 x10E6/uL   Hemoglobin 13.4 13.0 - 17.7 g/dL   Hematocrit 81.1 91.4 - 51.0 %   MCV 83 79 - 97 fL   MCH 27.3 26.6 - 33.0 pg   MCHC 33.0 31.5 - 35.7 g/dL   RDW 78.2  95.6 - 21.3 %   Platelets 270 150 - 379 x10E3/uL   Depression screen PHQ 2/9 10/01/2016  Decreased Interest 0  Down, Depressed, Hopeless 0  PHQ - 2 Score 0  Altered sleeping 0  Tired, decreased energy 0  Change in appetite 0  Feeling bad or failure about yourself  0  Trouble concentrating 0  Moving slowly or fidgety/restless 0  Suicidal thoughts 0  PHQ-9 Score 0   ASSESSMENT AND PLAN: 1. Itching -informed of dry skin. Recommend use of Eucerin Intensive Care Lotion OTC. -I do not see signs to suggest scabies but since wife also has similar symptoms with rash, we agreed to give rxn for Elimite cream to use if itching continues despite use of Moisturizing lotion. Patient advise to apply cream from head to sole of the feet, not to get cream in the eyes nose and mouth and wash off after 8 hours - permethrin (ELIMITE) 5 % cream; Apply cream from head to sole of feet.  Wash off after 8 hrs. May repeat once in 7 days if itching persists.  Dispense: 30 g; Refill: 1  2. Essential hypertension -at goal. Continue Norvasc and lisinopril - Comprehensive metabolic panel  3. Decreased hearing of right ear - Ambulatory referral to ENT  4. BPH with elevated PSA -Advised that we can see slight elevation in PSA with age and with BPH. Recheck level today. If stable we will observe. If further increase will refer to urology - PSA  5. Hyperlipidemia, unspecified hyperlipidemia type Continue Lipitor - Lipid panel  6. Need for influenza vaccination - Flu Vaccine QUAD 6+ mos PF IM (Fluarix Quad PF)   Patient was given the opportunity to ask questions.  Patient verbalized understanding of the plan and was able to repeat key elements of the plan.   Orders Placed This Encounter  Procedures  . Flu Vaccine QUAD 6+ mos PF IM (Fluarix Quad PF)  . Comprehensive metabolic panel  . PSA  . Lipid panel  . Ambulatory referral to ENT     Requested Prescriptions   Signed Prescriptions Disp Refills  .  permethrin (ELIMITE) 5 % cream 30 g 1    Sig: Apply cream from head to sole of feet.  Wash off after 8 hrs. May repeat once in 7 days if itching persists.    Return in about 4 months (around 05/07/2017).  Jonah Blue, MD, FACP

## 2017-01-05 NOTE — Patient Instructions (Addendum)
Continue current medications.  Use the Permethrin cream as prescribed.  Do not get in eyes or mouth.    Influenza Virus Vaccine injection (Fluarix) What is this medicine? INFLUENZA VIRUS VACCINE (in floo EN zuh VAHY ruhs vak SEEN) helps to reduce the risk of getting influenza also known as the flu. This medicine may be used for other purposes; ask your health care provider or pharmacist if you have questions. COMMON BRAND NAME(S): Fluarix, Fluzone What should I tell my health care provider before I take this medicine? They need to know if you have any of these conditions: -bleeding disorder like hemophilia -fever or infection -Guillain-Barre syndrome or other neurological problems -immune system problems -infection with the human immunodeficiency virus (HIV) or AIDS -low blood platelet counts -multiple sclerosis -an unusual or allergic reaction to influenza virus vaccine, eggs, chicken proteins, latex, gentamicin, other medicines, foods, dyes or preservatives -pregnant or trying to get pregnant -breast-feeding How should I use this medicine? This vaccine is for injection into a muscle. It is given by a health care professional. A copy of Vaccine Information Statements will be given before each vaccination. Read this sheet carefully each time. The sheet may change frequently. Talk to your pediatrician regarding the use of this medicine in children. Special care may be needed. Overdosage: If you think you have taken too much of this medicine contact a poison control center or emergency room at once. NOTE: This medicine is only for you. Do not share this medicine with others. What if I miss a dose? This does not apply. What may interact with this medicine? -chemotherapy or radiation therapy -medicines that lower your immune system like etanercept, anakinra, infliximab, and adalimumab -medicines that treat or prevent blood clots like warfarin -phenytoin -steroid medicines like  prednisone or cortisone -theophylline -vaccines This list may not describe all possible interactions. Give your health care provider a list of all the medicines, herbs, non-prescription drugs, or dietary supplements you use. Also tell them if you smoke, drink alcohol, or use illegal drugs. Some items may interact with your medicine. What should I watch for while using this medicine? Report any side effects that do not go away within 3 days to your doctor or health care professional. Call your health care provider if any unusual symptoms occur within 6 weeks of receiving this vaccine. You may still catch the flu, but the illness is not usually as bad. You cannot get the flu from the vaccine. The vaccine will not protect against colds or other illnesses that may cause fever. The vaccine is needed every year. What side effects may I notice from receiving this medicine? Side effects that you should report to your doctor or health care professional as soon as possible: -allergic reactions like skin rash, itching or hives, swelling of the face, lips, or tongue Side effects that usually do not require medical attention (report to your doctor or health care professional if they continue or are bothersome): -fever -headache -muscle aches and pains -pain, tenderness, redness, or swelling at site where injected -weak or tired This list may not describe all possible side effects. Call your doctor for medical advice about side effects. You may report side effects to FDA at 1-800-FDA-1088. Where should I keep my medicine? This vaccine is only given in a clinic, pharmacy, doctor's office, or other health care setting and will not be stored at home. NOTE: This sheet is a summary. It may not cover all possible information. If you have questions about this  medicine, talk to your doctor, pharmacist, or health care provider.  2018 Elsevier/Gold Standard (2007-11-16 09:30:40)

## 2017-01-06 ENCOUNTER — Telehealth: Payer: Self-pay | Admitting: Internal Medicine

## 2017-01-06 DIAGNOSIS — N400 Enlarged prostate without lower urinary tract symptoms: Secondary | ICD-10-CM

## 2017-01-06 DIAGNOSIS — R972 Elevated prostate specific antigen [PSA]: Principal | ICD-10-CM

## 2017-01-06 LAB — COMPREHENSIVE METABOLIC PANEL
A/G RATIO: 1.6 (ref 1.2–2.2)
ALK PHOS: 52 IU/L (ref 39–117)
ALT: 9 IU/L (ref 0–44)
AST: 18 IU/L (ref 0–40)
Albumin: 4.6 g/dL (ref 3.5–4.8)
BUN/Creatinine Ratio: 10 (ref 10–24)
BUN: 10 mg/dL (ref 8–27)
Bilirubin Total: 0.5 mg/dL (ref 0.0–1.2)
CO2: 22 mmol/L (ref 20–29)
Calcium: 9.5 mg/dL (ref 8.6–10.2)
Chloride: 105 mmol/L (ref 96–106)
Creatinine, Ser: 1.03 mg/dL (ref 0.76–1.27)
GFR calc Af Amer: 81 mL/min/{1.73_m2} (ref >59)
GFR calc non Af Amer: 70 mL/min/{1.73_m2} (ref >59)
Globulin, Total: 2.8 g/dL (ref 1.5–4.5)
Glucose: 76 mg/dL (ref 65–99)
POTASSIUM: 4.8 mmol/L (ref 3.5–5.2)
SODIUM: 142 mmol/L (ref 134–144)
Total Protein: 7.4 g/dL (ref 6.0–8.5)

## 2017-01-06 LAB — PSA: PROSTATE SPECIFIC AG, SERUM: 8 ng/mL — AB (ref 0.0–4.0)

## 2017-01-06 LAB — LIPID PANEL
Chol/HDL Ratio: 2.8 ratio (ref 0.0–5.0)
Cholesterol, Total: 164 mg/dL (ref 100–199)
HDL: 59 mg/dL (ref >39)
LDL Calculated: 88 mg/dL (ref 0–99)
TRIGLYCERIDES: 86 mg/dL (ref 0–149)
VLDL Cholesterol Cal: 17 mg/dL (ref 5–40)

## 2017-01-06 NOTE — Telephone Encounter (Signed)
PC placed to pt to go over lab results from today. I got his VM. I left message informing him that I was calling to go over lab results and that I will send results via Mychart. Pt encouraged to look at results and my recommendation. Call if any questions.  Pt with further elev of PSA. Will refer to urologist. Results for orders placed or performed in visit on 01/05/17  Comprehensive metabolic panel  Result Value Ref Range   Glucose 76 65 - 99 mg/dL   BUN 10 8 - 27 mg/dL   Creatinine, Ser 1.911.03 0.76 - 1.27 mg/dL   GFR calc non Af Amer 70 >59 mL/min/1.73   GFR calc Af Amer 81 >59 mL/min/1.73   BUN/Creatinine Ratio 10 10 - 24   Sodium 142 134 - 144 mmol/L   Potassium 4.8 3.5 - 5.2 mmol/L   Chloride 105 96 - 106 mmol/L   CO2 22 20 - 29 mmol/L   Calcium 9.5 8.6 - 10.2 mg/dL   Total Protein 7.4 6.0 - 8.5 g/dL   Albumin 4.6 3.5 - 4.8 g/dL   Globulin, Total 2.8 1.5 - 4.5 g/dL   Albumin/Globulin Ratio 1.6 1.2 - 2.2   Bilirubin Total 0.5 0.0 - 1.2 mg/dL   Alkaline Phosphatase 52 39 - 117 IU/L   AST 18 0 - 40 IU/L   ALT 9 0 - 44 IU/L  PSA  Result Value Ref Range   Prostate Specific Ag, Serum 8.0 (H) 0.0 - 4.0 ng/mL  Lipid panel  Result Value Ref Range   Cholesterol, Total 164 100 - 199 mg/dL   Triglycerides 86 0 - 149 mg/dL   HDL 59 >47>39 mg/dL   VLDL Cholesterol Cal 17 5 - 40 mg/dL   LDL Calculated 88 0 - 99 mg/dL   Chol/HDL Ratio 2.8 0.0 - 5.0 ratio

## 2017-02-03 MED FILL — LISINOPRIL 20 MG TAB: 20 | 30 days supply | Qty: 30 | Fill #2

## 2017-02-03 MED FILL — AMLODIPINE BESYLATE 10 MG T: 10 | 30 days supply | Qty: 30 | Fill #3

## 2017-02-03 MED FILL — ?TERAZOSIN 5 MG CAPSULE: 5 | 30 days supply | Qty: 30 | Fill #3

## 2017-03-02 MED FILL — AMLODIPINE BESYLATE 10 MG T: 10 | 30 days supply | Qty: 30 | Fill #4

## 2017-03-02 MED FILL — ?ATORVASTATIN 10 MG TABLET: 10 | 30 days supply | Qty: 30 | Fill #0

## 2017-03-02 MED FILL — LISINOPRIL 20 MG TAB: 20 | 30 days supply | Qty: 30 | Fill #3

## 2017-03-02 MED FILL — TERAZOSIN 5 MG CAPSULE: 5 | 30 days supply | Qty: 30 | Fill #4

## 2017-03-05 ENCOUNTER — Emergency Department (HOSPITAL_COMMUNITY): Payer: Self-pay

## 2017-03-05 ENCOUNTER — Encounter (HOSPITAL_COMMUNITY): Payer: Self-pay

## 2017-03-05 ENCOUNTER — Other Ambulatory Visit: Payer: Self-pay

## 2017-03-05 ENCOUNTER — Emergency Department (HOSPITAL_COMMUNITY)
Admission: EM | Admit: 2017-03-05 | Discharge: 2017-03-05 | Disposition: A | Discharge status: Home / Self Care | Payer: Self-pay | Attending: Emergency Medicine | Admitting: Emergency Medicine

## 2017-03-05 DIAGNOSIS — Z7982 Long term (current) use of aspirin: Secondary | ICD-10-CM | POA: Insufficient documentation

## 2017-03-05 DIAGNOSIS — L299 Pruritus, unspecified: Secondary | ICD-10-CM

## 2017-03-05 DIAGNOSIS — J069 Acute upper respiratory infection, unspecified: Secondary | ICD-10-CM | POA: Insufficient documentation

## 2017-03-05 DIAGNOSIS — R05 Cough: Secondary | ICD-10-CM | POA: Insufficient documentation

## 2017-03-05 DIAGNOSIS — B9789 Other viral agents as the cause of diseases classified elsewhere: Secondary | ICD-10-CM | POA: Insufficient documentation

## 2017-03-05 DIAGNOSIS — Z720 Tobacco use: Secondary | ICD-10-CM | POA: Insufficient documentation

## 2017-03-05 DIAGNOSIS — Z79899 Other long term (current) drug therapy: Secondary | ICD-10-CM | POA: Insufficient documentation

## 2017-03-05 DIAGNOSIS — I10 Essential (primary) hypertension: Secondary | ICD-10-CM | POA: Insufficient documentation

## 2017-03-05 LAB — RAPID STREP SCREEN (MED CTR MEBANE ONLY): Streptococcus, Group A Screen (Direct): NEGATIVE

## 2017-03-05 MED ORDER — PERMETHRIN 5 % EX CREA: TOPICAL_CREAM | CUTANEOUS | 1 refills | Status: AC

## 2017-03-05 NOTE — ED Triage Notes (Signed)
Pt endorses sore throat with non productive cough x 3 days. Afebrile in triage. Taking tylenol for pain. VSS.

## 2017-03-05 NOTE — ED Provider Notes (Signed)
[facility] EMERGENCY DEPARTMENT Provider Note   CSN: [id number] Arrival date & time: 03/05/17  1251     History   Chief Complaint Chief Complaint  Patient presents with  . Sore Throat  . Cough    HPI Steven Lane is a 76 y.o. male.  Patient c/o sore/scratchy throat, runny nose, non prod cough for past 3 days. Symptoms persistent, mild to moderate. Spouse w recent similar symptoms. No other known ill contacts. Denies chest pain or sob. No trouble swallowing. Is eating and drinking normally. Pt also notes persistent of pruritic rash to base of neck in back, and bil wrists. Tried permethrin and clotrimazole with no change. Denies any worsening of rash. No fever or chills. Denies change in home or personal products. Denies other new medication use.    The history is provided by the patient.  Sore Throat  Pertinent negatives include no chest pain, no abdominal pain, no headaches and no shortness of breath.  Cough  Associated symptoms include sore throat. Pertinent negatives include no chest pain, no headaches, no shortness of breath and no eye redness.    Past Medical History:  Diagnosis Date  . HTN (hypertension)   . Hypertension     Patient Active Problem List   Diagnosis Date Noted  . Hyperlipidemia 01/05/2017  . HTN (hypertension) 12/04/2014  . BPH with elevated PSA 12/04/2014    History reviewed. No pertinent surgical history.     Home Medications    Prior to Admission medications   Medication Sig Start Date End Date Taking? Authorizing Provider  amLODipine (NORVASC) 10 MG tablet Take 1 tablet (10 mg total) by mouth daily. 10/01/16   Marcine Matar, MD  aspirin 81 MG tablet Take 1 tablet (81 mg total) by mouth daily. 01/08/16   Pete Glatter, MD  atorvastatin (LIPITOR) 10 MG tablet Take 1 tablet (10 mg total) by mouth daily. 10/01/16   Marcine Matar, MD  diclofenac sodium (VOLTAREN) 1 % GEL Apply 2 g topically 4 (four) times daily.  10/01/16   Marcine Matar, MD  lisinopril (PRINIVIL,ZESTRIL) 20 MG tablet Take 1 tablet (20 mg total) by mouth daily. 10/01/16   Marcine Matar, MD  permethrin (ELIMITE) 5 % cream Apply cream from head to sole of feet.  Wash off after 8 hrs. May repeat once in 7 days if itching persists. 03/05/17   Marcine Matar, MD  terazosin (HYTRIN) 5 MG capsule Take 1 capsule (5 mg total) by mouth at bedtime. 10/01/16   Marcine Matar, MD    Family History History reviewed. No pertinent family history.  Social History Social History  Substance Use Topics  . Smoking status: Current Some Day Smoker    Types: Cigars  . Smokeless tobacco: Former Neurosurgeon     Comment: occasional  . Alcohol use 0.6 - 1.2 oz/week    1 - 2 Glasses of wine per week     Comment: with meals     Allergies   Patient has no known allergies.   Review of Systems Review of Systems  Constitutional: Negative for fever.  HENT: Positive for congestion and sore throat.   Eyes: Negative for discharge and redness.  Respiratory: Positive for cough. Negative for shortness of breath.   Cardiovascular: Negative for chest pain.  Gastrointestinal: Negative for abdominal pain.  Genitourinary: Negative for flank pain.  Musculoskeletal: Negative for neck pain and neck stiffness.  Skin: Negative for pallor.  Neurological: Negative for headaches.  Hematological: Does not bruise/bleed easily.  Psychiatric/Behavioral: Negative for confusion.     Physical Exam Updated Vital Signs BP (!) 149/71 (BP Location: Right Arm)   Pulse 67   Temp 98.3 F (36.8 C) (Oral)   Resp 18   Wt 75.3 kg (166 lb)   SpO2 99%   BMI 30.36 kg/m   Physical Exam  Constitutional: He is oriented to person, place, and time. He appears well-developed and well-nourished. No distress.  HENT:  Mouth/Throat: Oropharynx is clear and moist.  Eyes: Conjunctivae are normal. No scleral icterus.  Neck: Neck supple. No tracheal deviation present.  No  stiffness or rigidity  Cardiovascular: Normal rate, regular rhythm and intact distal pulses.  Exam reveals no gallop and no friction rub.   No murmur heard. Pulmonary/Chest: Effort normal and breath sounds normal. No accessory muscle usage. No respiratory distress.  Abdominal: Soft. Bowel sounds are normal. He exhibits no distension. There is no tenderness.  Musculoskeletal: He exhibits no edema.  Lymphadenopathy:    He has no cervical adenopathy.  Neurological: He is alert and oriented to person, place, and time.  Skin: Skin is warm and dry. No rash noted. He is not diaphoretic.  No discrete skin lesions/rash seen. Scratch marks noted.   Psychiatric: He has a normal mood and affect.  Nursing note and vitals reviewed.    ED Treatments / Results  Labs (all labs ordered are listed, but only abnormal results are displayed) Labs Reviewed  RAPID STREP SCREEN (NOT AT [facility])  CULTURE, GROUP A STREP [facility])    EKG  EKG Interpretation None       Radiology Dg Chest 2 View  Result Date: 03/05/2017 CLINICAL DATA:  sore throat with non productive cough x 3 days. EXAM: CHEST  2 VIEW COMPARISON:  05/26/2015 FINDINGS: Cardiac silhouette is normal in size. No mediastinal or hilar masses. No evidence of adenopathy. Lungs are mildly hyperexpanded but clear. No pleural effusion or pneumothorax. Skeletal structures are intact. IMPRESSION: No active cardiopulmonary disease. Electronically Signed   By: Amie Portlandavid  Ormond M.D.   On: 03/05/2017 14:06    Procedures Procedures (including critical care time)  Medications Ordered in ED Medications - No data to display   Initial Impression / Assessment and Plan / ED Course  I have reviewed the triage vital signs and the nursing notes.  Pertinent labs & imaging results that were available during my care of the patient were reviewed by me and considered in my medical decision making (see chart for details).  Strep test negative and cxr also normal.  Pts  uri symptoms appear most c/w viral uri - discussed w pt.  No discrete rash/skin lesions noted.  rec otc topical steroid cream +/- benadryl for symptom relief, and outpt f/u dermatology re pruritus/recent rash.    Final Clinical Impressions(s) / ED Diagnoses   Final diagnoses:  None    New Prescriptions New Prescriptions   No medications on file     Cathren LaineSteinl, Darcey Demma, MD 03/05/17 [phone]

## 2017-03-05 NOTE — Discharge Instructions (Signed)
It was our pleasure to provide your ER care today - we hope that you feel better.  For recent cough/congestion/sore throat appear most consistent with a viral/cold-like illness that should get better in the next week. Rest. Drink adequate fluids. Try over the counter cold and flu medication for symptom relief.  For itching, avoid any perfumed soaps, shampoos or detergents.  You may apply topical hydrocortisone cream and/or take benadryl as need for itching - these medications are available over the counter.  Also follow up with dermatologist as outpatient - call dermatology office Monday to arrange appointment.   Return to ER if worse, new symptoms, trouble breathing, other concern.

## 2017-03-05 NOTE — ED Notes (Signed)
Pt reports he feels "sick like flu"

## 2017-03-07 LAB — CULTURE, GROUP A STREP (THRC)

## 2017-03-19 ENCOUNTER — Ambulatory Visit: Payer: Self-pay | Admitting: Internal Medicine

## 2017-03-22 ENCOUNTER — Ambulatory Visit: Payer: Self-pay | Admitting: Internal Medicine

## 2017-03-30 MED FILL — LISINOPRIL 20 MG TABLET: 20 | 30 days supply | Qty: 30 | Fill #4

## 2017-03-30 MED FILL — AMLODIPINE BESYLATE 10 MG T: 10 | 30 days supply | Qty: 30 | Fill #5

## 2017-03-30 MED FILL — ?ATORVASTATIN 10 MG TABLET: 10 | 30 days supply | Qty: 30 | Fill #1

## 2017-04-02 ENCOUNTER — Ambulatory Visit: Payer: Self-pay

## 2017-04-05 MED FILL — TRIAMCINOLONE 0.1% OINTMENT: 0.1 | 11 days supply | Qty: 60 | Fill #0

## 2017-04-15 MED FILL — TRIAMCINOLONE 0.1% OINTMENT: 0.1 | 30 days supply | Qty: 80 | Fill #0

## 2017-04-15 MED FILL — hydrOXYzine HCL 25 MG TABS: 25 | 30 days supply | Qty: 30 | Fill #0

## 2017-04-16 MED FILL — ?PREDNISONE 10 MG TABLET: 10 | 12 days supply | Qty: 44 | Fill #0

## 2017-04-20 MED FILL — TERAZOSIN 5 MG CAPSULE: 5 | 30 days supply | Qty: 30 | Fill #5

## 2017-04-25 ENCOUNTER — Emergency Department (HOSPITAL_COMMUNITY): Payer: Self-pay

## 2017-04-25 ENCOUNTER — Encounter (HOSPITAL_COMMUNITY): Payer: Self-pay | Admitting: Emergency Medicine

## 2017-04-25 ENCOUNTER — Emergency Department (HOSPITAL_COMMUNITY)
Admission: EM | Admit: 2017-04-25 | Discharge: 2017-04-25 | Disposition: A | Discharge status: Home / Self Care | Payer: Self-pay | Attending: Emergency Medicine | Admitting: Emergency Medicine

## 2017-04-25 DIAGNOSIS — Z79899 Other long term (current) drug therapy: Secondary | ICD-10-CM | POA: Insufficient documentation

## 2017-04-25 DIAGNOSIS — M5432 Sciatica, left side: Secondary | ICD-10-CM | POA: Insufficient documentation

## 2017-04-25 DIAGNOSIS — F17290 Nicotine dependence, other tobacco product, uncomplicated: Secondary | ICD-10-CM | POA: Insufficient documentation

## 2017-04-25 DIAGNOSIS — M545 Low back pain: Secondary | ICD-10-CM | POA: Insufficient documentation

## 2017-04-25 DIAGNOSIS — I10 Essential (primary) hypertension: Secondary | ICD-10-CM | POA: Insufficient documentation

## 2017-04-25 MED ORDER — IBUPROFEN 600 MG PO TABS: 600.0000 mg | ORAL_TABLET | Freq: Four times a day (QID) | ORAL | 0 refills | Status: AC | PRN

## 2017-04-25 MED ORDER — METHYLPREDNISOLONE 4 MG PO TBPK: ORAL_TABLET | ORAL | 0 refills | Status: AC

## 2017-04-25 MED ORDER — HYDROCODONE-ACETAMINOPHEN 5-325 MG PO TABS
2.0000 | ORAL_TABLET | Freq: Once | ORAL | Status: AC
Start: 2017-04-25 — End: 2017-04-25
  Administered 2017-04-25: 2 via ORAL
  Filled 2017-04-25: qty 2

## 2017-04-25 MED ORDER — KETOROLAC TROMETHAMINE 60 MG/2ML IM SOLN
60.0000 mg | Freq: Once | INTRAMUSCULAR | Status: AC
Start: 2017-04-25 — End: 2017-04-25
  Administered 2017-04-25: 60 mg via INTRAMUSCULAR
  Filled 2017-04-25: qty 2

## 2017-04-25 MED ORDER — METHOCARBAMOL 500 MG PO TABS
500.0000 mg | ORAL_TABLET | Freq: Once | ORAL | Status: AC
  Administered 2017-04-25: 500 mg via ORAL
  Filled 2017-04-25: qty 1

## 2017-04-25 MED ORDER — HYDROCODONE-ACETAMINOPHEN 5-325 MG PO TABS: 1.0000 | ORAL_TABLET | Freq: Four times a day (QID) | ORAL | 0 refills | Status: AC | PRN

## 2017-04-25 NOTE — ED Notes (Signed)
To x-ray

## 2017-04-25 NOTE — ED Provider Notes (Signed)
[facility]   CSN: [id number] Arrival date & time: 04/25/17  1116     History   Chief Complaint Chief Complaint  Patient presents with  . Leg Pain    HPI Steven Lane is a 76 y.o. male.  HPI   76 year old male with history of hypertension who presents with left leg pain.  The patient states that over the last several days, he has had sharp, stabbing, burning pain that radiates from his left gluteal area down his left posterior thigh to his knee.  It occasionally radiates down towards the lateral aspect of his leg but does not radiate to the foot.  He denies any associated numbness or weakness.  No loss of bowel or bladder function.  No abdominal pain.  No nausea or vomiting.  No fevers, chills, or weight loss.  No night sweats.  Denies any history of previous injuries or falls.  Denies any known history of cancer.  Pain is worse with lifting his leg in certain positions.  He also has a sharp, intermittent spasm that shoots down his left lower back towards his leg.  No specific alleviating factors.  Past Medical History:  Diagnosis Date  . HTN (hypertension)   . Hypertension     Patient Active Problem List   Diagnosis Date Noted  . Hyperlipidemia 01/05/2017  . HTN (hypertension) 12/04/2014  . BPH with elevated PSA 12/04/2014    History reviewed. No pertinent surgical history.     Home Medications    Prior to Admission medications   Medication Sig Start Date End Date Taking? Authorizing Provider  amLODipine (NORVASC) 10 MG tablet Take 1 tablet (10 mg total) by mouth daily. 10/01/16   Marcine MatarJohnson, Deborah B, MD  aspirin 81 MG tablet Take 1 tablet (81 mg total) by mouth daily. 01/08/16   Pete GlatterLangeland, Dawn T, MD  atorvastatin (LIPITOR) 10 MG tablet Take 1 tablet (10 mg total) by mouth daily. 10/01/16   Marcine MatarJohnson, Deborah B, MD  diclofenac sodium (VOLTAREN) 1 % GEL Apply 2 g topically 4 (four) times daily. 10/01/16   Marcine MatarJohnson, Deborah  B, MD  HYDROcodone-acetaminophen (NORCO/VICODIN) 5-325 MG tablet Take 1-2 tablets by mouth every 6 (six) hours as needed for moderate pain or severe pain. 04/25/17   Shaune PollackIsaacs, Zadiel Leyh, MD  ibuprofen (ADVIL,MOTRIN) 600 MG tablet Take 1 tablet (600 mg total) by mouth every 6 (six) hours as needed for moderate pain. 04/25/17   Shaune PollackIsaacs, Istvan Behar, MD  lisinopril (PRINIVIL,ZESTRIL) 20 MG tablet Take 1 tablet (20 mg total) by mouth daily. 10/01/16   Marcine MatarJohnson, Deborah B, MD  methylPREDNISolone (MEDROL DOSEPAK) 4 MG TBPK tablet Take as directed on package 04/25/17   Shaune PollackIsaacs, Lasaundra Riche, MD  permethrin (ELIMITE) 5 % cream Apply cream from head to sole of feet.  Wash off after 8 hrs. May repeat once in 7 days if itching persists. 03/05/17   Marcine MatarJohnson, Deborah B, MD  terazosin (HYTRIN) 5 MG capsule Take 1 capsule (5 mg total) by mouth at bedtime. 10/01/16   Marcine MatarJohnson, Deborah B, MD    Family History No family history on file.  Social History Social History   Tobacco Use  . Smoking status: Current Some Day Smoker    Types: Cigars  . Smokeless tobacco: Former NeurosurgeonUser  . Tobacco comment: occasional  Substance Use Topics  . Alcohol use: Yes    Alcohol/week: 0.6 - 1.2 oz    Types: 1 - 2 Glasses of wine per week  Comment: with meals  . Drug use: No     Allergies   Patient has no known allergies.   Review of Systems Review of Systems  Musculoskeletal: Positive for arthralgias and back pain.  All other systems reviewed and are negative.    Physical Exam Updated Vital Signs BP 133/74 (BP Location: Right Arm)   Pulse 69   Temp 98.4 F (36.9 C)   Resp 16   SpO2 99%   Physical Exam  Constitutional: He is oriented to person, place, and time. He appears well-developed and well-nourished. No distress.  HENT:  Head: Normocephalic and atraumatic.  Eyes: Conjunctivae are normal.  Neck: Neck supple.  Cardiovascular: Normal rate, regular rhythm and normal heart sounds. Exam reveals no friction rub.  No murmur  heard. Pulmonary/Chest: Effort normal and breath sounds normal. No respiratory distress. He has no wheezes. He has no rales.  Abdominal: He exhibits no distension.  Soft, nondistended, nontender.  No palpable masses.  No hepatosplenomegaly.  No pulsatile masses.  Musculoskeletal: He exhibits no edema.  Neurological: He is alert and oriented to person, place, and time. He exhibits normal muscle tone.  Skin: Skin is warm. Capillary refill takes less than 2 seconds.  Psychiatric: He has a normal mood and affect.  Nursing note and vitals reviewed.   Spine Exam: Inspection/Palpation: Significant pinpoint tenderness over left lower lumbosacral spine along the left paraspinal muscles.  Positive straight leg on the left. Strength: 5/5 throughout LE bilaterally (hip flexion/extension, adduction/abduction; knee flexion/extension; foot dorsiflexion/plantarflexion, inversion/eversion; great toe inversion) Sensation: Intact to light touch in proximal and distal LE bilaterally Reflexes: 2+ quadriceps and achilles reflexes  ED Treatments / Results  Labs (all labs ordered are listed, but only abnormal results are displayed) Labs Reviewed - No data to display  EKG  EKG Interpretation None       Radiology Dg Lumbar Spine Complete  Result Date: 04/25/2017 CLINICAL DATA:  Left leg pain, pain since yesterday EXAM: LUMBAR SPINE - COMPLETE 4+ VIEW COMPARISON:  None. FINDINGS: There are 5 nonrib bearing lumbar-type vertebral bodies. The vertebral body heights are maintained. There is generalized osteopenia. The alignment is anatomic. There is no static listhesis. There is no spondylolysis. There is no acute fracture. There is degenerative disc disease with disc height loss and facet arthropathy at L5-S1. There is degenerative disc disease with mild disc height loss at L4-5. The SI joints are unremarkable. 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 IMPRESSION: 1.  No acute osseous injury of the lumbar spine.  Electronically Signed   By: Elige KoHetal  Patel   On: 04/25/2017 13:32    Procedures Procedures (including critical care time)  Medications Ordered in ED Medications  ketorolac (TORADOL) injection 60 mg (60 mg Intramuscular Given 04/25/17 1338)  methocarbamol (ROBAXIN) tablet 500 mg (500 mg Oral Given 04/25/17 1338)  HYDROcodone-acetaminophen (NORCO/VICODIN) 5-325 MG per tablet 2 tablet (2 tablets Oral Given 04/25/17 1338)     Initial Impression / Assessment and Plan / ED Course  I have reviewed the triage vital signs and the nursing notes.  Pertinent labs & imaging results that were available during my care of the patient were reviewed by me and considered in my medical decision making (see chart for details).    76 year old male with past medical history as above who presents with intermittent sharp, stabbing, burning left leg pain down the posterior aspect of his leg.  Imaging shows degenerative disc disease and height loss at L4 through S1.  I suspect the patient has  acute sciatica.  He has no lower extremity weakness, numbness, loss of bowel or bladder, or red flag symptoms to suggest cauda equina or acute cord compression.  No bony lesions on plain films.  He has no abdominal pain, pulsatile abdominal mass, or evidence to suggest AAA or other referred pain from abdominal etiology.  Will give him a course of steroids, anti-inflammatories, and pain medication, with outpatient follow-up.  This note was prepared with assistance of Conservation officer, historic buildings. Occasional wrong-word or sound-a-like substitutions may have occurred due to the inherent limitations of voice recognition software.   Final Clinical Impressions(s) / ED Diagnoses   Final diagnoses:  Sciatica of left side    ED Discharge Orders        Ordered    HYDROcodone-acetaminophen (NORCO/VICODIN) 5-325 MG tablet  Every 6 hours PRN     04/25/17 1355    ibuprofen (ADVIL,MOTRIN) 600 MG tablet  Every 6 hours PRN      04/25/17 1355    methylPREDNISolone (MEDROL DOSEPAK) 4 MG TBPK tablet     04/25/17 1355       Shaune Pollack, MD 04/25/17 1356

## 2017-04-25 NOTE — ED Notes (Signed)
Left leg pain since last night denies injury

## 2017-04-25 NOTE — ED Triage Notes (Addendum)
Pt to ED c/o L, lateral, upper leg pain, states it started last night but he has felt it before. Reports nothing helps, no radiation. No calf or popiteal tenderness. Denies numbness/tingling. CSM intact bilaterally. Ambulatory with steady gait in triage.

## 2017-04-30 MED FILL — ?ATORVASTATIN 10 MG TABLET: 10 | 30 days supply | Qty: 30 | Fill #2

## 2017-04-30 MED FILL — LISINOPRIL 20 MG TAB: 20 | 90 days supply | Qty: 90 | Fill #5

## 2017-04-30 MED FILL — AMLODIPINE BESYLATE 10 MG T: 10 | 90 days supply | Qty: 90 | Fill #6

## 2017-05-20 MED FILL — TERAZOSIN 5 MG CAPSULE: 5 | 30 days supply | Qty: 30 | Fill #6

## 2017-06-07 MED FILL — ATORVASTATIN 10 MG TABLET: 10 | 90 days supply | Qty: 90 | Fill #3

## 2017-06-15 MED FILL — TERAZOSIN 5 MG CAPSULE: 5 | 30 days supply | Qty: 30 | Fill #7

## 2018-07-09 IMAGING — CR DG LUMBAR SPINE COMPLETE 4+V
5 series · 5 of 5 positions shown · non-contrast
Comparison: None.

CLINICAL DATA: Left leg pain, pain since yesterday

EXAM:
LUMBAR SPINE - COMPLETE 4+ VIEW

[l-spine ap]
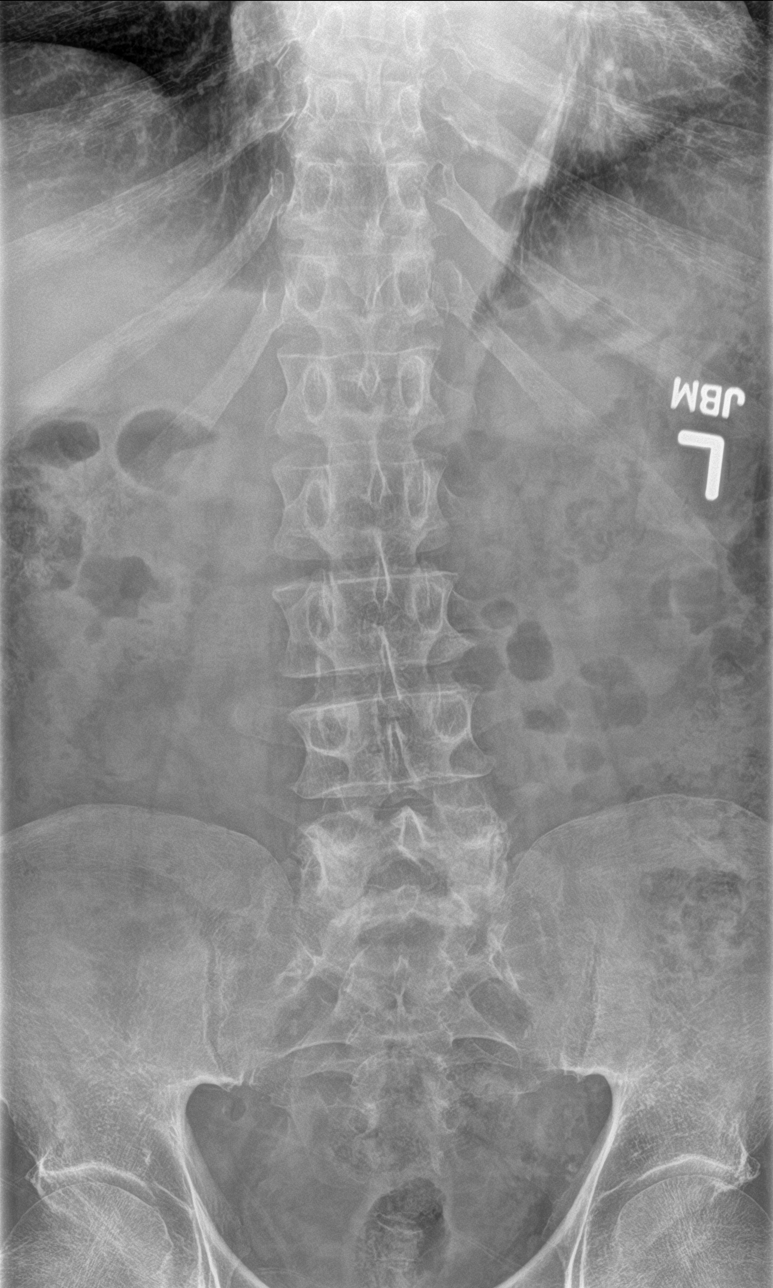

[l-spine obl (1 of 2)]
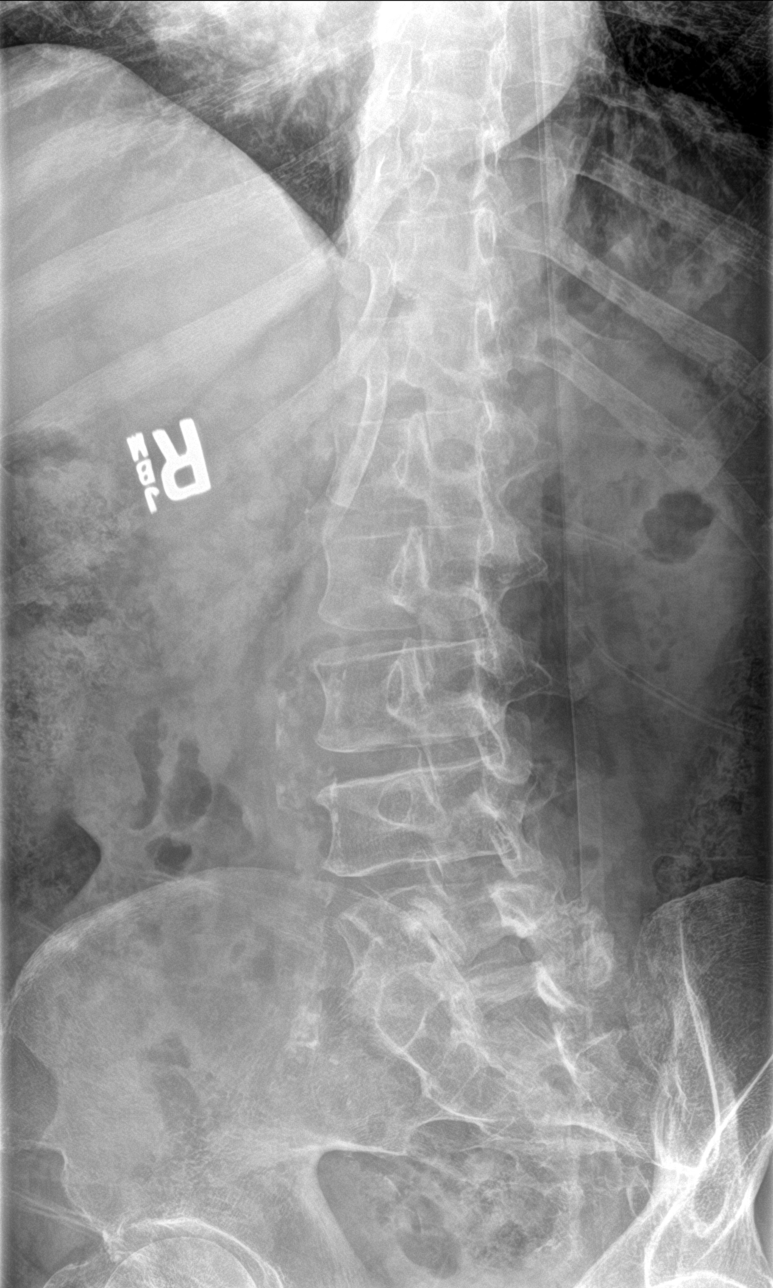

[l-spine obl (2 of 2)]
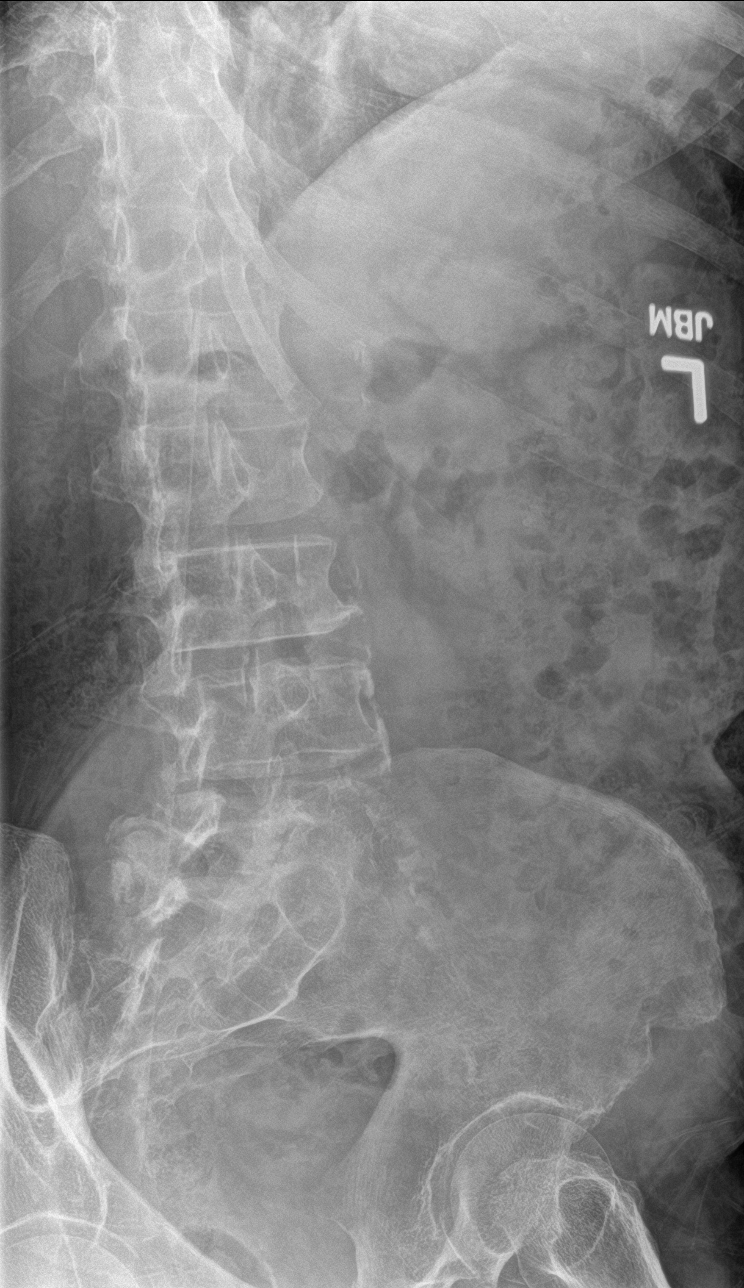

[l-spine lat]
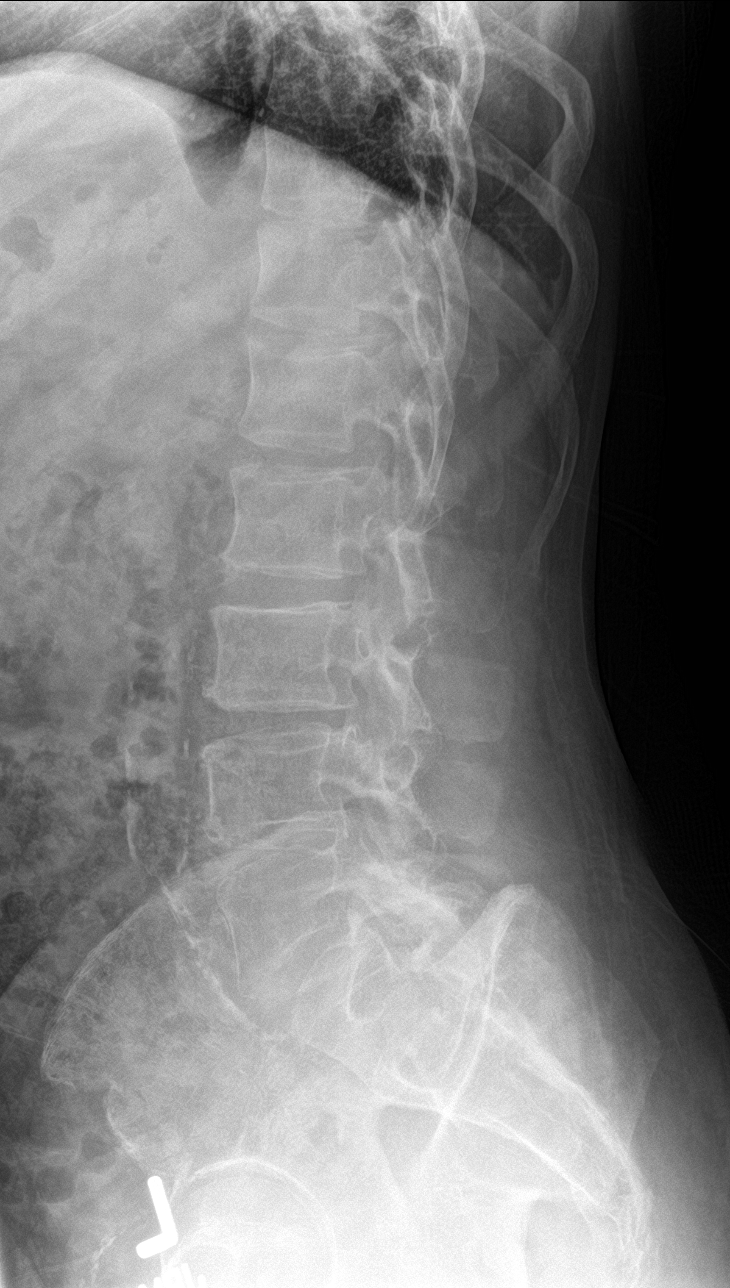

[l-spine spot]
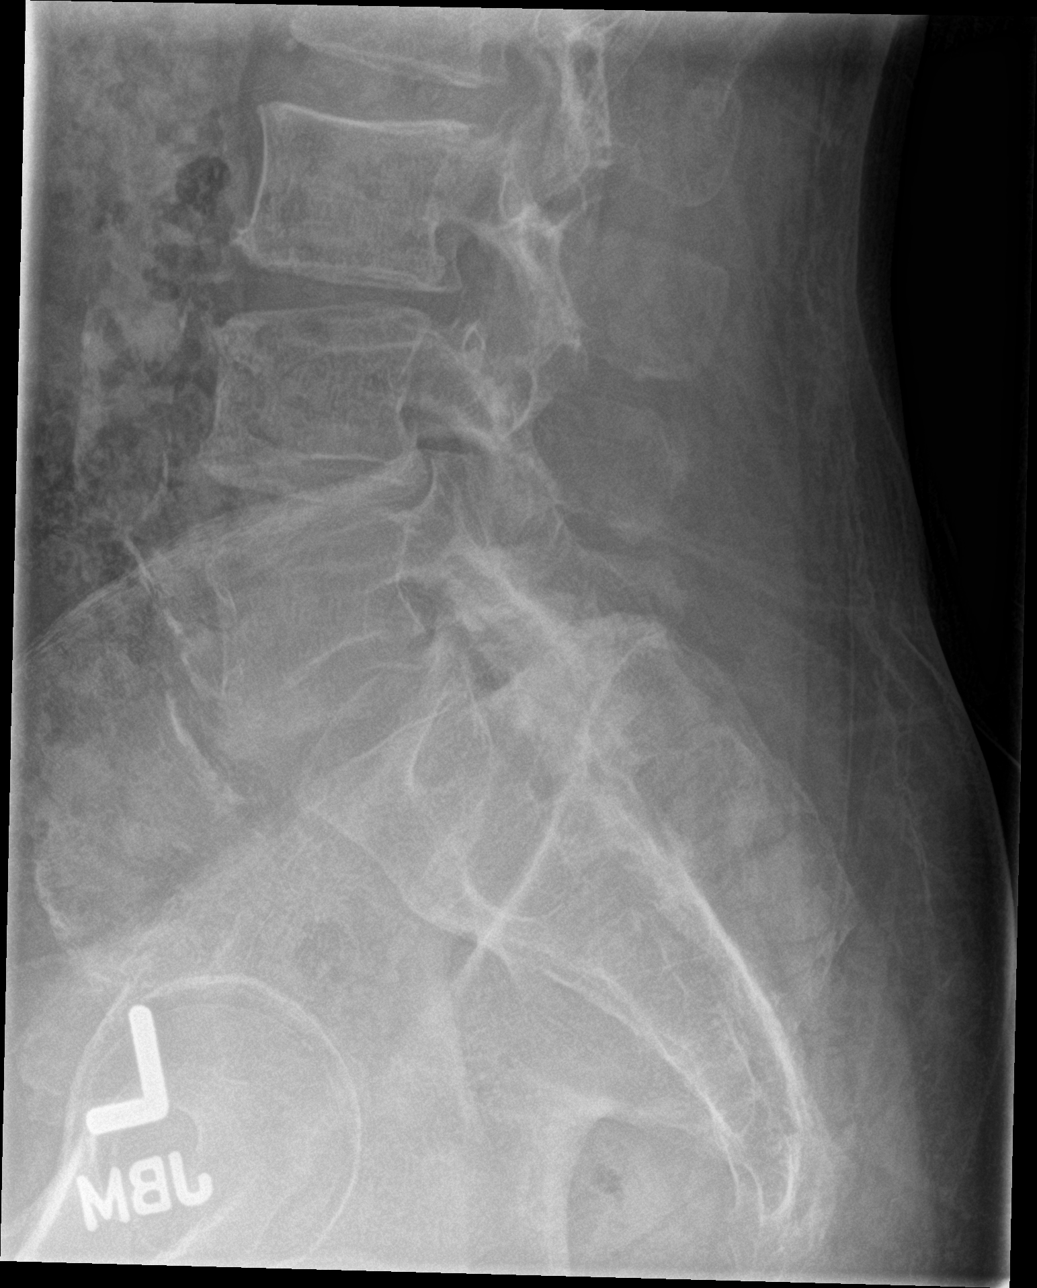

[5 of 5 positions shown; findings below may reference images not displayed]

FINDINGS: There are 5 nonrib bearing lumbar-type vertebral bodies.

The vertebral body heights are maintained. There is generalized
osteopenia.

The alignment is anatomic. There is no static listhesis. There is no
spondylolysis.

There is no acute fracture.

There is degenerative disc disease with disc height loss and facet
arthropathy at L5-S1. There is degenerative disc disease with mild
disc height loss at L4-5.

The SI joints are unremarkable.

There is abdominal aortic atherosclerosis.
IMPRESSION: 1.  No acute osseous injury of the lumbar spine.
